# Patient Record
Sex: Male | Born: 1935 | Race: White | Hispanic: No | Smoking: Former smoker
Health system: Southern US, Community
[De-identification: ages and names within clinical notes are randomized; demographics above are authoritative.]

## PROBLEM LIST (undated history)

## (undated) DIAGNOSIS — K219 Gastro-esophageal reflux disease without esophagitis: Secondary | ICD-10-CM

## (undated) DIAGNOSIS — J40 Bronchitis, not specified as acute or chronic: Secondary | ICD-10-CM

## (undated) DIAGNOSIS — I2510 Atherosclerotic heart disease of native coronary artery without angina pectoris: Secondary | ICD-10-CM

## (undated) DIAGNOSIS — J189 Pneumonia, unspecified organism: Secondary | ICD-10-CM

## (undated) DIAGNOSIS — I4891 Unspecified atrial fibrillation: Secondary | ICD-10-CM

## (undated) DIAGNOSIS — E669 Obesity, unspecified: Secondary | ICD-10-CM

## (undated) DIAGNOSIS — K5900 Constipation, unspecified: Secondary | ICD-10-CM

## (undated) DIAGNOSIS — R609 Edema, unspecified: Secondary | ICD-10-CM

## (undated) DIAGNOSIS — R269 Unspecified abnormalities of gait and mobility: Secondary | ICD-10-CM

## (undated) DIAGNOSIS — I2699 Other pulmonary embolism without acute cor pulmonale: Secondary | ICD-10-CM

## (undated) DIAGNOSIS — G2581 Restless legs syndrome: Secondary | ICD-10-CM

## (undated) DIAGNOSIS — R32 Unspecified urinary incontinence: Secondary | ICD-10-CM

## (undated) DIAGNOSIS — I10 Essential (primary) hypertension: Secondary | ICD-10-CM

## (undated) DIAGNOSIS — G459 Transient cerebral ischemic attack, unspecified: Secondary | ICD-10-CM

## (undated) DIAGNOSIS — G4730 Sleep apnea, unspecified: Secondary | ICD-10-CM

## (undated) DIAGNOSIS — I509 Heart failure, unspecified: Secondary | ICD-10-CM

## (undated) HISTORY — PX: HIP SURGERY: SHX245

## (undated) HISTORY — PX: KNEE SURGERY: SHX244

## (undated) HISTORY — DX: Sleep apnea, unspecified: G47.30

## (undated) HISTORY — DX: Unspecified abnormalities of gait and mobility: R26.9

## (undated) HISTORY — DX: Unspecified atrial fibrillation: I48.91

## (undated) HISTORY — DX: Atherosclerotic heart disease of native coronary artery without angina pectoris: I25.10

## (undated) HISTORY — DX: Obesity, unspecified: E66.9

## (undated) HISTORY — DX: Other pulmonary embolism without acute cor pulmonale: I26.99

## (undated) HISTORY — PX: TONSILLECTOMY: SUR1361

## (undated) HISTORY — PX: APPENDECTOMY: SHX54

## (undated) HISTORY — PX: TOTAL HIP ARTHROPLASTY: SHX124

## (undated) HISTORY — PX: CHOLECYSTECTOMY: SHX55

## (undated) HISTORY — PX: ADENOIDECTOMY: SUR15

## (undated) HISTORY — DX: Edema, unspecified: R60.9

## (undated) HISTORY — DX: Constipation, unspecified: K59.00

## (undated) HISTORY — PX: REPLACEMENT TOTAL KNEE BILATERAL: SUR1225

---

## 2016-03-02 ENCOUNTER — Encounter: Payer: Self-pay | Admitting: Gastroenterology

## 2016-04-06 ENCOUNTER — Encounter: Payer: Self-pay | Admitting: *Deleted

## 2016-04-06 DIAGNOSIS — G2581 Restless legs syndrome: Secondary | ICD-10-CM | POA: Insufficient documentation

## 2016-04-06 DIAGNOSIS — K5900 Constipation, unspecified: Secondary | ICD-10-CM | POA: Insufficient documentation

## 2016-04-06 DIAGNOSIS — I482 Chronic atrial fibrillation, unspecified: Secondary | ICD-10-CM | POA: Insufficient documentation

## 2016-04-06 DIAGNOSIS — G4730 Sleep apnea, unspecified: Secondary | ICD-10-CM | POA: Insufficient documentation

## 2016-04-06 DIAGNOSIS — I25810 Atherosclerosis of coronary artery bypass graft(s) without angina pectoris: Secondary | ICD-10-CM

## 2016-04-06 DIAGNOSIS — Z86711 Personal history of pulmonary embolism: Secondary | ICD-10-CM | POA: Insufficient documentation

## 2016-04-09 ENCOUNTER — Encounter (HOSPITAL_COMMUNITY): Payer: Self-pay | Admitting: Family Medicine

## 2016-04-09 ENCOUNTER — Emergency Department (HOSPITAL_COMMUNITY)
Admission: EM | Admit: 2016-04-09 | Discharge: 2016-04-10 | Disposition: A | Discharge status: Home / Self Care | Payer: Medicare Other | Attending: Emergency Medicine | Admitting: Emergency Medicine

## 2016-04-09 ENCOUNTER — Emergency Department (HOSPITAL_COMMUNITY): Payer: Medicare Other

## 2016-04-09 DIAGNOSIS — Z8673 Personal history of transient ischemic attack (TIA), and cerebral infarction without residual deficits: Secondary | ICD-10-CM | POA: Diagnosis not present

## 2016-04-09 DIAGNOSIS — N3001 Acute cystitis with hematuria: Secondary | ICD-10-CM | POA: Diagnosis not present

## 2016-04-09 DIAGNOSIS — J181 Lobar pneumonia, unspecified organism: Secondary | ICD-10-CM | POA: Insufficient documentation

## 2016-04-09 DIAGNOSIS — I110 Hypertensive heart disease with heart failure: Secondary | ICD-10-CM | POA: Diagnosis not present

## 2016-04-09 DIAGNOSIS — I509 Heart failure, unspecified: Secondary | ICD-10-CM | POA: Diagnosis not present

## 2016-04-09 DIAGNOSIS — Z87891 Personal history of nicotine dependence: Secondary | ICD-10-CM | POA: Diagnosis not present

## 2016-04-09 DIAGNOSIS — Z7901 Long term (current) use of anticoagulants: Secondary | ICD-10-CM | POA: Insufficient documentation

## 2016-04-09 DIAGNOSIS — J189 Pneumonia, unspecified organism: Secondary | ICD-10-CM

## 2016-04-09 DIAGNOSIS — R933 Abnormal findings on diagnostic imaging of other parts of digestive tract: Secondary | ICD-10-CM | POA: Diagnosis not present

## 2016-04-09 DIAGNOSIS — R05 Cough: Secondary | ICD-10-CM | POA: Diagnosis present

## 2016-04-09 HISTORY — DX: Heart failure, unspecified: I50.9

## 2016-04-09 HISTORY — DX: Unspecified atrial fibrillation: I48.91

## 2016-04-09 HISTORY — DX: Unspecified urinary incontinence: R32

## 2016-04-09 HISTORY — DX: Gastro-esophageal reflux disease without esophagitis: K21.9

## 2016-04-09 HISTORY — DX: Transient cerebral ischemic attack, unspecified: G45.9

## 2016-04-09 HISTORY — DX: Essential (primary) hypertension: I10

## 2016-04-09 HISTORY — DX: Restless legs syndrome: G25.81

## 2016-04-09 HISTORY — DX: Bronchitis, not specified as acute or chronic: J40

## 2016-04-09 LAB — URINALYSIS, ROUTINE W REFLEX MICROSCOPIC
BILIRUBIN URINE: NEGATIVE
GLUCOSE, UA: NEGATIVE mg/dL
KETONES UR: NEGATIVE mg/dL
Nitrite: NEGATIVE
PROTEIN: NEGATIVE mg/dL
Specific Gravity, Urine: 1.015 (ref 1.005–1.030)
pH: 7 (ref 5.0–8.0)

## 2016-04-09 LAB — CBC
HCT: 44.3 % (ref 39.0–52.0)
Hemoglobin: 14.1 g/dL (ref 13.0–17.0)
MCH: 30.2 pg (ref 26.0–34.0)
MCHC: 31.8 g/dL (ref 30.0–36.0)
MCV: 94.9 fL (ref 78.0–100.0)
PLATELETS: 264 10*3/uL (ref 150–400)
RBC: 4.67 MIL/uL (ref 4.22–5.81)
RDW: 14.5 % (ref 11.5–15.5)
WBC: 10.2 10*3/uL (ref 4.0–10.5)

## 2016-04-09 LAB — BASIC METABOLIC PANEL
Anion gap: 6 (ref 5–15)
BUN: 12 mg/dL (ref 6–20)
CALCIUM: 8.7 mg/dL — AB (ref 8.9–10.3)
CHLORIDE: 100 mmol/L — AB (ref 101–111)
CO2: 31 mmol/L (ref 22–32)
CREATININE: 0.77 mg/dL (ref 0.61–1.24)
GFR calc Af Amer: >60 mL/min (ref >60)
GFR calc non Af Amer: >60 mL/min (ref >60)
Glucose, Bld: 109 mg/dL — ABNORMAL HIGH (ref 65–99)
Potassium: 4.2 mmol/L (ref 3.5–5.1)
SODIUM: 137 mmol/L (ref 135–145)

## 2016-04-09 LAB — URINE MICROSCOPIC-ADD ON
RBC / HPF: NONE SEEN RBC/hpf (ref 0–5)
Squamous Epithelial / LPF: NONE SEEN

## 2016-04-09 LAB — BRAIN NATRIURETIC PEPTIDE: B Natriuretic Peptide: 170.8 pg/mL — ABNORMAL HIGH (ref 0.0–100.0)

## 2016-04-09 LAB — TROPONIN I: Troponin I: <0.03 ng/mL (ref <0.03)

## 2016-04-09 LAB — LIPASE, BLOOD: Lipase: 26 U/L (ref 11–51)

## 2016-04-09 LAB — HEPATIC FUNCTION PANEL
ALK PHOS: 63 U/L (ref 38–126)
ALT: 31 U/L (ref 17–63)
AST: 46 U/L — ABNORMAL HIGH (ref 15–41)
Albumin: 3.2 g/dL — ABNORMAL LOW (ref 3.5–5.0)
BILIRUBIN DIRECT: 0.1 mg/dL (ref 0.1–0.5)
BILIRUBIN INDIRECT: 0.7 mg/dL (ref 0.3–0.9)
BILIRUBIN TOTAL: 0.8 mg/dL (ref 0.3–1.2)
TOTAL PROTEIN: 6.9 g/dL (ref 6.5–8.1)

## 2016-04-09 LAB — CBG MONITORING, ED: Glucose-Capillary: 113 mg/dL — ABNORMAL HIGH (ref 65–99)

## 2016-04-09 MED ORDER — FUROSEMIDE 10 MG/ML IJ SOLN
60.0000 mg | Freq: Once | INTRAMUSCULAR | Status: AC
  Administered 2016-04-09: 60 mg via INTRAVENOUS
  Filled 2016-04-09: qty 8

## 2016-04-09 MED ORDER — LEVOFLOXACIN 750 MG PO TABS: 750.0000 mg | ORAL_TABLET | Freq: Every day | ORAL | Status: DC

## 2016-04-09 MED ORDER — SODIUM CHLORIDE 0.9 % IV BOLUS (SEPSIS)
500.0000 mL | Freq: Once | INTRAVENOUS | Status: AC
  Administered 2016-04-09: 500 mL via INTRAVENOUS

## 2016-04-09 MED ORDER — SODIUM CHLORIDE 0.9 % IJ SOLN
INTRAMUSCULAR | Status: AC
  Filled 2016-04-09: qty 50

## 2016-04-09 MED ORDER — MORPHINE SULFATE (PF) 4 MG/ML IV SOLN
4.0000 mg | Freq: Once | INTRAVENOUS | Status: AC
  Administered 2016-04-09: 4 mg via INTRAVENOUS
  Filled 2016-04-09: qty 1

## 2016-04-09 MED ORDER — LEVOFLOXACIN 500 MG PO TABS: 500.0000 mg | ORAL_TABLET | Freq: Every day | ORAL | 0 refills | Status: DC

## 2016-04-09 MED ORDER — METOPROLOL TARTRATE 5 MG/5ML IV SOLN: 5.0000 mg | Freq: Once | INTRAVENOUS | Status: DC

## 2016-04-09 MED ORDER — IOPAMIDOL (ISOVUE-300) INJECTION 61%
100.0000 mL | Freq: Once | INTRAVENOUS | Status: AC | PRN
  Administered 2016-04-09: 100 mL via INTRAVENOUS

## 2016-04-09 MED ORDER — CEPHALEXIN 500 MG PO CAPS
500.0000 mg | ORAL_CAPSULE | Freq: Once | ORAL | Status: AC
  Administered 2016-04-09: 500 mg via ORAL
  Filled 2016-04-09: qty 1

## 2016-04-09 MED ORDER — IOPAMIDOL (ISOVUE-300) INJECTION 61%
INTRAVENOUS | Status: AC
  Filled 2016-04-09: qty 100

## 2016-04-09 NOTE — ED Notes (Signed)
Pt in radiology 

## 2016-04-09 NOTE — ED Triage Notes (Signed)
[facility] Patient was working with physical therapy when he had a witnessed syncopal episode lasting about 15 seconds. Pt is complaining of a headache. Pain described as pressure.

## 2016-04-09 NOTE — ED Notes (Signed)
Bed: WA21 Expected date:  Expected time:  Means of arrival:  Comments: EMS- 82 yo M, syncope

## 2016-04-09 NOTE — ED Provider Notes (Signed)
[facility]-EMERGENCY DEPT Provider Note   CSN: [id number] Arrival date & time: 04/09/16  1552     History   Chief Complaint Chief Complaint  Patient presents with  . Loss of Consciousness    HPI Joseph Barrera is a 82 y.o. male.  The history is provided by the patient.   [facility] with a [facility] syncopal episode.  Complaint mild headache at this time.  Denies chest pain shortness of breath.  Reports upper abdominal discomfort.  No fevers or chills.  Mild productive cough.  Patient is recently relocated to [place] from [place].  He has a history of A. fib and congestive heart failure as well as hypertension and gastroesophageal reflux disease.  He reports is compliant with his medications.  He states she's had a long-standing history of syncope has had extensive workup in the [place] system.  He states that his had intermittent syncopal episodes for 4 years and no clear etiology can be found.  His had both cardiac and GI workups.  He also reports a long-standing history of recurrent upper abdominal pain for which she's had several endoscopies.    Past Medical History:  Diagnosis Date  . A-fib (HCC)   . Bronchitis   . CHF (congestive heart failure) (HCC)   . GERD (gastroesophageal reflux disease)   . Hypertension   . Restless leg syndrome   . TIA (transient ischemic attack)   . Urinary bladder incontinence     There are no active problems to display for this patient.   Past Surgical History:  Procedure Laterality Date  . ADENOIDECTOMY    . APPENDECTOMY    . CHOLECYSTECTOMY    . HIP SURGERY Left   . KNEE SURGERY Bilateral   . TONSILLECTOMY         Home Medications    Prior to Admission medications   Medication Sig Start Date End Date Taking? Authorizing Provider  b complex vitamins capsule Take 1 capsule by mouth daily.   Yes Historical Provider, MD  cetirizine (ZYRTEC) 10 MG tablet Take 10 mg by mouth  daily.   Yes Historical Provider, MD  Cholecalciferol (VITAMIN D3 ULTRA STRENGTH) 5000 units capsule Take 5,000 Units by mouth daily.   Yes Historical Provider, MD  furosemide (LASIX) 40 MG tablet Take 40 mg by mouth daily.   Yes Historical Provider, MD  Multiple Vitamins-Minerals (MULTIVITAMIN ADULT) TABS Take 1 tablet by mouth daily.   Yes Historical Provider, MD  omeprazole (PRILOSEC) 20 MG capsule Take 20 mg by mouth every evening.   Yes Historical Provider, MD  potassium chloride SA (K-DUR,KLOR-CON) 20 MEQ tablet Take 20 mEq by mouth 2 (two) times daily.   Yes Historical Provider, MD  rOPINIRole (REQUIP) 4 MG tablet Take 4 mg by mouth daily.   Yes Historical Provider, MD  traMADol (ULTRAM) 50 MG tablet Take 50 mg by mouth every 6 (six) hours as needed for moderate pain or severe pain.   Yes Historical Provider, MD  warfarin (COUMADIN) 5 MG tablet Take 2.5-5 mg by mouth daily. Take 1 tablet (5 mg) by mouth Mon-Fri and Take 0.5 tablet (2.5 mg) by mouth Sat-Sun   Yes Historical Provider, MD    Family History History reviewed. No pertinent family history.  Social History Social History  Substance Use Topics  . Smoking status: Former Smoker    Quit date: 1963  . Smokeless tobacco: Never Used  . Alcohol use No     Comment:  Stopped in 2010     Allergies   Benadryl [diphenhydramine] and Oxycodone   Review of Systems Review of Systems  All other systems reviewed and are negative.    Physical Exam Updated Vital Signs BP 124/76 (BP Location: Left Arm)   Pulse 114   Temp 97.1 F (36.2 C) (Oral)   Resp 19   Ht 5\' 11"  (1.803 m)   Wt 177 lb (80.3 kg)   SpO2 94%   BMI 24.69 kg/m   Physical Exam  Constitutional: He is oriented to person, place, and time. He appears well-developed and well-nourished.  HENT:  Head: Normocephalic and atraumatic.  Eyes: EOM are normal.  Neck: Normal range of motion.  Cardiovascular: Normal rate, regular rhythm, normal heart sounds and intact  distal pulses.   Pulmonary/Chest: Effort normal and breath sounds normal. No respiratory distress.  Abdominal: Soft. He exhibits no distension. There is no tenderness.  Musculoskeletal: Normal range of motion.  Neurological: He is alert and oriented to person, place, and time.  Skin: Skin is warm and dry.  Psychiatric: He has a normal mood and affect. Judgment normal.  Nursing note and vitals reviewed.    ED Treatments / Results  Labs (all labs ordered are listed, but only abnormal results are displayed) Labs Reviewed  BASIC METABOLIC PANEL - Abnormal; Notable for the following:       Result Value   Chloride 100 (*)    Glucose, Bld 109 (*)    Calcium 8.7 (*)    All other components within normal limits  URINALYSIS, ROUTINE W REFLEX MICROSCOPIC (NOT AT [facility]) - Abnormal; Notable for the following:    APPearance CLOUDY (*)    Hgb urine dipstick SMALL (*)    Leukocytes, UA LARGE (*)    All other components within normal limits  HEPATIC FUNCTION PANEL - Abnormal; Notable for the following:    Albumin 3.2 (*)    AST 46 (*)    All other components within normal limits  URINE MICROSCOPIC-ADD ON - Abnormal; Notable for the following:    Bacteria, UA MANY (*)    All other components within normal limits  BRAIN NATRIURETIC PEPTIDE - Abnormal; Notable for the following:    B Natriuretic Peptide 170.8 (*)    All other components within normal limits  CBG MONITORING, ED - Abnormal; Notable for the following:    Glucose-Capillary 113 (*)    All other components within normal limits  URINE CULTURE  CBC  TROPONIN I  LIPASE, BLOOD    EKG  EKG Interpretation None       Radiology Dg Chest 2 View  Result Date: 04/09/2016 CLINICAL DATA:  Short of breath EXAM: CHEST  2 VIEW COMPARISON:  None. FINDINGS: Lungs are under aerated. Patchy airspace opacities are seen throughout the left lung. The right hemidiaphragm is elevated. There is vascular crowding and atelectasis at the right base.  No pneumothorax. IMPRESSION: Extensive patchy airspace disease throughout the left lung. Electronically Signed   By: Arthur  Hoss M.D.   On: 04/09/2016 20:39   Ct Abdomen Pelvis W Contrast  Result Date: 04/09/2016 CLINICAL DATA:  Status post syncope, with headache. Concern for abdominal injury. Initial encounter. EXAM: CT ABDOMEN AND PELVIS WITH CONTRAST TECHNIQUE: Multidetector CT imaging of the abdomen and pelvis was performed using the standard protocol following bolus administration of intravenous contrast. CONTRAST:  <MEASUREMEAvondale EstKaiser Fnd Hosp - GYork <MEASUREMENTSan PatrCastlevieGShady<MEASUREMENTTallmMemorial HosGPalisad<MEASUREMENTWoodMainegeneral MediGAl<MEASUREMENTSouthGrand VieGPryo<MEASUREMENTRanchos Penitas Dartmouth [facility] EndoscGMan<MEASUREMENTUnion Saint JameGFore<MEASUREMENTRancho Mission VCoffeyville Regional Medi<MEASUREMENTLoBronson Battle CreeGAp<MEASUREMENTPhiladelOcala Regional MediGM46.9dison Bi00 IOPAMIDOL (ISOVUE-300) INJECTION 61% COMPARISON:  None. FINDINGS: Lower chest: Patchy bibasilar airspace opacification may reflect atelectasis or pneumonia. Mild  coronary artery calcification is noted. Hepatobiliary: Scattered calcified granulomata are noted throughout the liver. The patient is status post cholecystectomy, with clips noted at the gallbladder fossa. The common bile duct remains normal in caliber. Pancreas: The pancreas is within normal limits. Spleen: Scattered calcified granulomata are seen within the spleen. Adrenals/Urinary Tract: The adrenal glands are unremarkable in appearance. Nonspecific perinephric stranding is noted bilaterally. There is no evidence of hydronephrosis. No renal or ureteral stones are identified. A small right renal cyst is noted. Stomach/Bowel: The stomach is unremarkable in appearance. The small bowel is within normal limits. The patient is status post appendectomy. The colon is unremarkable in appearance. Vascular/Lymphatic: Scattered calcification is seen along the abdominal aorta and its branches. No retroperitoneal or pelvic sidewall lymphadenopathy is seen. Reproductive: Bladder wall thickening raises concern for cystitis. The prostate is borderline enlarged. Other: No additional soft tissue abnormalities are seen. Musculoskeletal: No acute osseous abnormalities are identified. The patient's left hip arthroplasty is grossly  unremarkable in appearance, though incompletely characterized. Vacuum phenomenon is noted along the lower lumbar spine. There is marked diffuse atrophy of the paraspinal musculature, and of the musculature about the left hip. The visualized musculature is unremarkable in appearance. IMPRESSION: 1. Bladder wall thickening raises concern for cystitis. 2. Patchy bibasilar airspace opacification may reflect atelectasis or possibly pneumonia. 3. Small right renal cyst noted. 4. Scattered aortic atherosclerosis. 5. Borderline enlarged prostate. 6. Marked diffuse atrophy of the paraspinal musculature, and of the musculature about the left hip. Electronically Signed   By: Roanna Raider M.D.   On: 04/09/2016 19:14    Procedures Procedures (including critical care time)  Medications Ordered in ED Medications  iopamidol (ISOVUE-300) 61 % injection (not administered)  sodium chloride 0.9 % injection (not administered)  levofloxacin (LEVAQUIN) tablet 750 mg (not administered)  sodium chloride 0.9 % bolus 500 mL (0 mLs Intravenous Stopped 04/09/16 1922)  morphine 4 MG/ML injection 4 mg (4 mg Intravenous Given 04/09/16 1719)  cephALEXin (KEFLEX) capsule 500 mg (500 mg Oral Given 04/09/16 1724)  iopamidol (ISOVUE-300) 61 % injection 100 mL (100 mLs Intravenous Contrast Given 04/09/16 1857)  furosemide (LASIX) injection 60 mg (60 mg Intravenous Given 04/09/16 2047)     Initial Impression / Assessment and Plan / ED Course  I have reviewed the triage vital signs and the nursing notes.  Pertinent labs & imaging results that were available during my care of the patient were reviewed by me and considered in my medical decision making (see chart for details).  Clinical Course     Patient treated for acute cystitis.  He states that he felt slightly short of breath when he laid flat for the CT scan.  He feels like he would benefit from Lasix.  He states he intermittently takes Lasix when he has fluid buildup.  He  reports no significant new swelling in his lower extremities.   Is given dose of Lasix and states that his breathing feels much better at this time.  I will cover him for possible left-sided community acquired pneumonia.  This may be asymmetric pulmonary edema.  Otherwise BMP is somewhat reassuring.  Regards to syncope both he and his daughter report that his had an extensive workup for this.  He has known A. fib.  He is rate controlled here.  He'll be started on Levaquin which should cover both his urinary tract infection as well as any pneumonia.  He ambulated in the ER with a walker and had no hypoxia and no  increased work of breathing.  He feels good like to go home.  I spoke with both he and his daughter.  His daughter is agreeable to discharge home.  She will follow-up with him in the nursing home tomorrow.  He has staff that rounds on him in the nursing home.  He understands to return to the ER for new or worsening symptoms  Final Clinical Impressions(s) / ED Diagnoses   Final diagnoses:  Acute cystitis with hematuria  Community acquired pneumonia of left lower lobe of lung (HCC)    New Prescriptions New Prescriptions   LEVOFLOXACIN (LEVAQUIN) 500 MG TABLET    Take 1 tablet (500 mg total) by mouth daily.     Azalia Bilis, MD 04/10/16 [phone]

## 2016-04-09 NOTE — ED Notes (Signed)
Lab is adding on BNP. (Joseph Barrera)

## 2016-04-09 NOTE — ED Notes (Signed)
Applied oxygen 2L via [place] due to patient's oxygen level dropped while doing orthostatics.

## 2016-04-10 DIAGNOSIS — J181 Lobar pneumonia, unspecified organism: Secondary | ICD-10-CM | POA: Diagnosis not present

## 2016-04-10 MED ORDER — METOPROLOL TARTRATE 25 MG PO TABS
25.0000 mg | ORAL_TABLET | Freq: Once | ORAL | Status: AC
  Administered 2016-04-10: 25 mg via ORAL
  Filled 2016-04-10: qty 1

## 2016-04-10 NOTE — ED Notes (Signed)
[facility] here for patient transportation.

## 2016-04-10 NOTE — ED Notes (Signed)
Notified [facility] for transportation back to facility.  

## 2016-04-12 LAB — URINE CULTURE: Culture: >=100000 — AB

## 2016-04-13 ENCOUNTER — Telehealth (HOSPITAL_BASED_OUTPATIENT_CLINIC_OR_DEPARTMENT_OTHER): Payer: Self-pay

## 2016-04-13 NOTE — Telephone Encounter (Signed)
Post ED Visit - Positive Culture Follow-up  Culture report reviewed by antimicrobial stewardship pharmacist:  []  Enzo BiNathan Batchelder, Pharm.D. []  Celedonio MiyamotoJeremy Frens, Pharm.D., BCPS []  Garvin FilaMike Maccia, Pharm.D. []  Georgina PillionElizabeth Martin, Pharm.D., BCPS []  NicolausMinh Pham, [address] BoulevardPharm.D., BCPS, AAHIVP []  Estella HuskMichelle Turner, Pharm.D., BCPS, AAHIVP []  Tennis Mustassie Stewart, [address]Pharm.D. []  Rob Hall SummitVincent, [place]Pharm.D.  Casilda Carlsaylor Stone Pharm D Positive urine culture Treated with levofloxacin, organism sensitive to the same and no further patient follow-up is required at this time.  Jerry CarasCullom, Lois Ostrom Burnett 04/13/2016, 12:04 PM

## 2016-04-17 ENCOUNTER — Encounter: Payer: Self-pay | Admitting: Cardiology

## 2016-04-17 ENCOUNTER — Ambulatory Visit (INDEPENDENT_AMBULATORY_CARE_PROVIDER_SITE_OTHER): Payer: Medicare Other | Admitting: Cardiology

## 2016-04-17 VITALS — BP 100/52 | HR 82 | Ht 69.0 in | Wt 277.8 lb

## 2016-04-17 DIAGNOSIS — I482 Chronic atrial fibrillation, unspecified: Secondary | ICD-10-CM

## 2016-04-17 DIAGNOSIS — I25810 Atherosclerosis of coronary artery bypass graft(s) without angina pectoris: Secondary | ICD-10-CM

## 2016-04-17 NOTE — Patient Instructions (Signed)
Medication Instructions:  Your physician recommends that you continue on your current medications as directed. Please refer to the Current Medication list given to you today.   Labwork: None ordered  Testing/Procedures: None ordered  Follow-Up: Your physician recommends that you schedule a follow-up appointment in: 2-3 MONTHS WITH DR. Elberta FortisAMNITZ   Any Other Special Instructions Will Be Listed Below (If Applicable).    If you need a refill on your cardiac medications before your next appointment, please call your pharmacy.

## 2016-04-17 NOTE — Progress Notes (Signed)
04/17/2016 Joseph Barrera   [birth year]  [id number]  Primary Physician No primary care provider on file. Primary Cardiologist: New (Dr. Elberta Fortisamnitz, DOD)   Reason for Visit/CC: New Patient Evaluation for Atrial Fibrillation  HPI:  Joseph Barrera presents to clinic today as a new patient. He has been referred by [facility] He recently moved to [place] from [place] to be closer to family. He resides at an assisted living facility. He has significant cardiovascular history and has been followed by a cardiologist in [place]. His history is significant for atrial fibrillation, TIA 2, PE/DVT, obstructive sleep apnea, reported history of CAD/MI however the patient does not recall ever having a heart catheterization. He reports that he had a chemical stress test less than a year ago and was told that it was okay. He has also had an echocardiogram within the last year. He has chronic lower extremity edema and wears compression stockings. He is also on Lasix as well as a beta blocker. His INRs are followed at his assisted living facility.  From a symptom standpoint, he is asymptomatic. He has trace bilateral lower extremity edema however he is wearing compression stockings. He denies any resting dyspnea. No anginal symptomatology. He notes atypical chest pain. It hurts his chest when he bend over. No orthopnea or PND. He is fully compliant with his CPAP. His EKG shows atrial fibrillation with controlled ventricular response of 82 bpm. Blood pressure is soft but stable at 100/52. He denies any dizziness, lightheadedness, syncope/near-syncope.  Current Meds  Medication Sig  . cetirizine (ZYRTEC) 10 MG chewable tablet Chew 10 mg by mouth daily.  . Cholecalciferol (VITAMIN D3) 5000 units CAPS Take 5,000 Units by mouth daily.  . fluticasone-salmeterol (ADVAIR HFA) 45-21 MCG/ACT inhaler Inhale 2 puffs into the lungs 2 (two) times daily as needed.  . furosemide (LASIX) 40 MG tablet Take 40 mg by  mouth.  . metoprolol (LOPRESSOR) 50 MG tablet Take 50 mg by mouth 3 (three) times daily.  Marland Kitchen. omeprazole (PRILOSEC) 20 MG capsule Take 20 mg by mouth daily.  . potassium chloride SA (K-DUR,KLOR-CON) 20 MEQ tablet Take 20 mEq by mouth 2 (two) times daily.  Marland Kitchen. rOPINIRole (REQUIP) 4 MG tablet Take 4 mg by mouth at bedtime.  . traMADol (ULTRAM) 50 MG tablet Take by mouth every 6 (six) hours as needed.  . vitamin B-12 (CYANOCOBALAMIN) 500 MCG tablet Take 500 mcg by mouth daily.  Marland Kitchen. warfarin (COUMADIN) 5 MG tablet Take 5 mg by mouth daily.   Allergies  Allergen Reactions  . Oxycodone Other (See Comments)    nightmares   Past Medical History:  Diagnosis Date  . Atrial fibrillation (HCC)   . CAD (coronary artery disease)   . Constipation   . Edema   . Gait disturbance   . Obesity   . Pulmonary embolism (HCC)   . Restless leg syndrome   . Sleep hypopnea    Family History  Problem Relation Age of Onset  . CVA Father   . Lead poisoning Brother    Past Surgical History:  Procedure Laterality Date  . APPENDECTOMY    . CHOLECYSTECTOMY    . REPLACEMENT TOTAL KNEE BILATERAL    . TOTAL HIP ARTHROPLASTY Left    Social History   Social History  . Marital status: Widowed    Spouse name: N/A  . Number of children: 3  . Years of education: N/A   Occupational History  . retired    Social History Main  Topics  . Smoking status: Former Smoker    Types: Cigarettes    Quit date: 1963  . Smokeless tobacco: Never Used  . Alcohol use No  . Drug use: No  . Sexual activity: Not on file   Other Topics Concern  . Not on file   Social History Narrative  . No narrative on file     Review of Systems: General: negative for chills, fever, night sweats or weight changes.  Cardiovascular: negative for chest pain, dyspnea on exertion, edema, orthopnea, palpitations, paroxysmal nocturnal dyspnea or shortness of breath Dermatological: negative for rash Respiratory: negative for cough or  wheezing Urologic: negative for hematuria Abdominal: negative for nausea, vomiting, diarrhea, bright red blood per rectum, melena, or hematemesis Neurologic: negative for visual changes, syncope, or dizziness All other systems reviewed and are otherwise negative except as noted above.   Physical Exam:  Blood pressure (!) 100/52, pulse 82, height 5\' 9"  (1.753 m), weight 277 lb 12 oz (126 kg).  General appearance: alert, cooperative and no distress Neck: no carotid bruit, no JVD, supple, symmetrical, trachea midline and thyroid not enlarged, symmetric, no tenderness/mass/nodules Lungs: clear to auscultation bilaterally Heart: irregularly irregular rhythm Extremities: trace bilatearl LEE Pulses: 2+ and symmetric Skin: Skin color, texture, turgor normal. No rashes or lesions Neurologic: Grossly normal  EKG atrial fibrillation 82 bpm   ASSESSMENT AND PLAN:   1. Atrial Fibrillation: EKG shows CVR. HR 82 bpm. Continue BB therapy with metoprolol. He has a h/o TIAs and is on chronic coumadin therapy. INRs are followed at ALF.  2. Presumed Diastolic? Systolic HF: Pt with chronic bilateral LEE, for which he takes lasix. He reports he had an echocardiogram within the last year. We will attempt to obtain these records from [place].  3. ?CAD: pt reports h/o MI. He denies any stents. He had a chemical stress test within the past year. [facility]  4. 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   5. OSA: he reports full compliance with CPAP.   PLAN  Pt is stable from a cardiac standpoint. He is asymptomatic. BP and HR both stable. He is on appropriate medical therapy. We will contact his former cardiologist in [place] for cardiac records (Dr. Arlana LindauKatan Desai, [phone]). F/u in 2-3 months with Dr. Elberta Fortisamnitz. I've discussed plan with Dr. Elberta Fortisamnitz, DOD. He has agreed to be the patient's primary cardiologist.   Robbie LisBrittainy Avonna Iribe PA-C 04/17/2016 3:36 PM

## 2016-04-18 ENCOUNTER — Encounter: Payer: Self-pay | Admitting: Cardiology

## 2016-04-19 NOTE — Addendum Note (Signed)
Addended by: Osa CraverGUIJOZA, Yahya Boldman M on: 04/19/2016 03:09 PM   Modules accepted: Orders

## 2016-05-02 ENCOUNTER — Encounter: Payer: Self-pay | Admitting: Cardiology

## 2016-05-03 ENCOUNTER — Ambulatory Visit (INDEPENDENT_AMBULATORY_CARE_PROVIDER_SITE_OTHER): Payer: Medicare Other | Admitting: Gastroenterology

## 2016-05-03 ENCOUNTER — Encounter: Payer: Self-pay | Admitting: Gastroenterology

## 2016-05-03 VITALS — BP 92/44 | HR 80 | Ht 69.0 in | Wt 274.0 lb

## 2016-05-03 DIAGNOSIS — I25810 Atherosclerosis of coronary artery bypass graft(s) without angina pectoris: Secondary | ICD-10-CM | POA: Diagnosis not present

## 2016-05-03 DIAGNOSIS — R1084 Generalized abdominal pain: Secondary | ICD-10-CM

## 2016-05-03 DIAGNOSIS — K5900 Constipation, unspecified: Secondary | ICD-10-CM | POA: Diagnosis not present

## 2016-05-03 MED ORDER — GABAPENTIN 300 MG PO CAPS: ORAL_CAPSULE | ORAL | 3 refills | Status: AC

## 2016-05-03 MED ORDER — POLYETHYLENE GLYCOL 3350 17 GM/SCOOP PO POWD: ORAL | 3 refills | Status: DC

## 2016-05-03 NOTE — Patient Instructions (Signed)
If you are age 80 or older, your body mass index should be between 23-30. Your Body mass index is 40.46 kg/m. If this is out of the aforementioned range listed, please consider follow up with your Primary Care Provider.  If you are age 80 or younger, your body mass index should be between 19-25. Your Body mass index is 40.46 kg/m. If this is out of the aformentioned range listed, please consider follow up with your Primary Care Provider.   We have sent the following medications to your pharmacy for you to pick up at your convenience:  Gabapentin 300mg . Take one tablet nightly for 1 week then increase to two tablets nightly.  Miralax. Take twice daily.   Please purchase the over the counter cream called Capcaison to use as needed.  We will contact your PCP regarding your stool studies.  Thank you.

## 2016-05-03 NOTE — Progress Notes (Signed)
HPI :  82 y/o male with a history of atrial fibrillation/CHF, history of syncope, history of pulmonary embolism on Coumadin, unstable gait here for abdominal pain and constipation.   He reports he has been having abdominal pains in his abdomen for "at least 3 years". He endorses pain in the entire abdomen, upper, mid, and lower - diffusely all over. He reports it feels like a stabbing sharp pain, feels like his body goes to sleep and he experiences "numbness" at times in his abdomen. He reports it is present constantly, but severity fluctuates. He reports usually 4-5/10, rated 9/10 today.   No vomiting. He eats well. Sleeps well. Positional changes such as twisting changes can make symptoms worse. Eating does not make at all, he has no limitations with diet. He has a bowel movement once per week, if that. He reports having a bowel movement does not make his abdomen feel better. No blood in the stools. He reports having had stool testing for blood and negative recently. He has had a prior colonoscopy around age 82, not sure of the result. No FH of colon cancer.  He uses a walker to walk, had left hip replacement, chronic instability of gait.Marland Kitchen. He has a history of low back pain.   He takes omeprazole 20mg  daily, he is not sure why. He does not have any heartburn. He has been taking bentyl PRN which does not help.   He had a CT scan November 20 - showing bladder wall thickening, scattered atherosclerosis of the aorta, borderline enlarged prostate. He had a UTI and was treated for cystitis.     Past Medical History:  Diagnosis Date  . A-fib (HCC)   . Atrial fibrillation (HCC)   . Bronchitis   . CAD (coronary artery disease)   . CHF (congestive heart failure) (HCC)   . Constipation   . Edema   . Gait disturbance   . GERD (gastroesophageal reflux disease)   . Hypertension   . Obesity   . Pulmonary embolism (HCC)   . Restless leg syndrome   . Sleep hypopnea   . TIA (transient ischemic  attack)   . Urinary bladder incontinence      Past Surgical History:  Procedure Laterality Date  . ADENOIDECTOMY    . APPENDECTOMY    . CHOLECYSTECTOMY    . HIP SURGERY Left   . KNEE SURGERY Bilateral   . REPLACEMENT TOTAL KNEE BILATERAL    . TONSILLECTOMY    . TOTAL HIP ARTHROPLASTY Left    Family History  Problem Relation Age of Onset  . CVA Father   . Lead poisoning Brother    Social History  Substance Use Topics  . Smoking status: Former Smoker    Types: Cigarettes    Quit date: 1963  . Smokeless tobacco: Never Used  . Alcohol use No     Comment: Stopped in 2010   Current Outpatient Prescriptions  Medication Sig Dispense Refill  . b complex vitamins capsule Take 1 capsule by mouth daily.    . cetirizine (ZYRTEC) 10 MG tablet Take 10 mg by mouth daily.    . Cholecalciferol (VITAMIN D3 ULTRA STRENGTH) 5000 units capsule Take 5,000 Units by mouth daily.    Marland Kitchen. dicyclomine (BENTYL) 20 MG tablet Take 20 mg by mouth every 6 (six) hours.    . furosemide (LASIX) 40 MG tablet Take 40 mg by mouth 2 (two) times daily.    Marland Kitchen. ipratropium-albuterol (DUONEB) 0.5-2.5 (3) MG/3ML SOLN Take 3  mLs by nebulization every 6 (six) hours as needed.    . metoprolol (LOPRESSOR) 50 MG tablet Take 50 mg by mouth 3 (three) times daily.    . Multiple Vitamins-Minerals (MULTIVITAMIN ADULT) TABS Take 1 tablet by mouth daily.    Marland Kitchen omeprazole (PRILOSEC) 20 MG capsule Take 20 mg by mouth daily.    . potassium chloride SA (K-DUR,KLOR-CON) 20 MEQ tablet Take 20 mEq by mouth 2 (two) times daily.    Marland Kitchen rOPINIRole (REQUIP) 4 MG tablet Take 4 mg by mouth at bedtime.    Marland Kitchen spironolactone (ALDACTONE) 50 MG tablet Take 50 mg by mouth 2 (two) times daily.    . vitamin B-12 (CYANOCOBALAMIN) 500 MCG tablet Take 500 mcg by mouth daily.    Marland Kitchen gabapentin (NEURONTIN) 300 MG capsule Take 300mg  nightly for 1 week then increase to 2 tablets nightly. 21 capsule 3  . polyethylene glycol powder (GLYCOLAX/MIRALAX) powder Mix 17g  in 8oz of water twice daily. May titrate as needed. 255 g 3   No current facility-administered medications for this visit.    Allergies  Allergen Reactions  . Oxycodone Other (See Comments)    nightmares  . Benadryl [Diphenhydramine]   . Oxycodone      Review of Systems: All systems reviewed and negative except where noted in HPI.   Lab Results  Component Value Date   WBC 10.2 04/09/2016   HGB 14.1 04/09/2016   HCT 44.3 04/09/2016   MCV 94.9 04/09/2016   PLT 264 04/09/2016    Lab Results  Component Value Date   CREATININE 0.77 04/09/2016   BUN 12 04/09/2016   NA 137 04/09/2016   K 4.2 04/09/2016   CL 100 (L) 04/09/2016   CO2 31 04/09/2016    Lab Results  Component Value Date   ALT 31 04/09/2016   AST 46 (H) 04/09/2016   ALKPHOS 63 04/09/2016   BILITOT 0.8 04/09/2016    Lab Results  Component Value Date   LIPASE 26 04/09/2016      Dg Chest 2 View  Result Date: 04/09/2016 CLINICAL DATA:  Short of breath EXAM: CHEST  2 VIEW COMPARISON:  None. FINDINGS: Lungs are under aerated. Patchy airspace opacities are seen throughout the left lung. The right hemidiaphragm is elevated. There is vascular crowding and atelectasis at the right base. No pneumothorax. IMPRESSION: Extensive patchy airspace disease throughout the left lung. Electronically Signed   By: Jolaine Click M.D.   On: 04/09/2016 20:39   Ct Abdomen Pelvis W Contrast  Result Date: 04/09/2016 CLINICAL DATA:  Status post syncope, with headache. Concern for abdominal injury. Initial encounter. EXAM: CT ABDOMEN AND PELVIS WITH CONTRAST TECHNIQUE: Multidetector CT imaging of the abdomen and pelvis was performed using the standard protocol following bolus administration of intravenous contrast. CONTRAST:  ISOVUE-300 IOPAMIDOL (ISOVUE-300) INJECTION 61% COMPARISON:  None. FINDINGS: Lower chest: Patchy bibasilar airspace opacification may reflect atelectasis or pneumonia. Mild coronary artery calcification is  noted. Hepatobiliary: Scattered calcified granulomata are noted throughout the liver. The patient is status post cholecystectomy, with clips noted at the gallbladder fossa. The common bile duct remains normal in caliber. Pancreas: The pancreas is within normal limits. Spleen: Scattered calcified granulomata are seen within the spleen. Adrenals/Urinary Tract: The adrenal glands are unremarkable in appearance. Nonspecific perinephric stranding is noted bilaterally. There is no evidence of hydronephrosis. No renal or ureteral stones are identified. A small right renal cyst is noted. Stomach/Bowel: The stomach is unremarkable in appearance. The small bowel is within  normal limits. The patient is status post appendectomy. The colon is unremarkable in appearance. Vascular/Lymphatic: Scattered calcification is seen along the abdominal aorta and its branches. No retroperitoneal or pelvic sidewall lymphadenopathy is seen. Reproductive: Bladder wall thickening raises concern for cystitis. The prostate is borderline enlarged. Other: No additional soft tissue abnormalities are seen. Musculoskeletal: No acute osseous abnormalities are identified. The patient's left hip arthroplasty is grossly unremarkable in appearance, though incompletely characterized. Vacuum phenomenon is noted along the lower lumbar spine. There is marked diffuse atrophy of the paraspinal musculature, and of the musculature about the left hip. The visualized musculature is unremarkable in appearance. IMPRESSION: 1. Bladder wall thickening raises concern for cystitis. 2. Patchy bibasilar airspace opacification may reflect atelectasis or possibly pneumonia. 3. Small right renal cyst noted. 4. Scattered aortic atherosclerosis. 5. Borderline enlarged prostate. 6. Marked diffuse atrophy of the paraspinal musculature, and of the musculature about the left hip. Electronically Signed   By: Roanna RaiderJeffery  Chang M.D.   On: 04/09/2016 19:14    Physical Exam: BP (!) 92/44    Pulse 80   Ht 5\' 9"  (1.753 m)   Wt 274 lb (124.3 kg)   BMI 40.46 kg/m  Constitutional: Pleasant, male in no acute distress, using walker HEENT: Normocephalic and atraumatic. Conjunctivae are normal. No scleral icterus. Neck supple.  Cardiovascular: Normal rate, irregularly irregular.  Pulmonary/chest: Effort normal and breath sounds normal. No wheezing, rales or rhonchi. Abdominal: Soft, protuberant / obese abdomen, tender diffusely to palpation in all area. No rash. . There are no masses palpable.  Extremities: (+) 2 edema BL LE Lymphadenopathy: No cervical adenopathy noted. Neurological: Alert and oriented to person place and time. Skin: Skin is warm and dry. No rashes noted. Psychiatric: Normal mood and affect. Behavior is normal.   ASSESSMENT AND PLAN: 82 year old male with multiple comorbidities as outlined above presenting for a new patient evaluation for chronic abdominal pain and constipation. He's had a CT scan within the past few weeks showing cystitis otherwise no acute pathology. He has some scattered aortic atherosclerosis but his symptoms are not consistent with mesenteric ischemia. Labs relatively normal. Given his history of constant/24 7 pain as well as worsening with positional changes and his abdominal exam today, I suspect he more than likely has abdominal wall/neuropathic pain. Discussed this with him and potential treatment options. Not sure if this is related to his other musculoskeletal pain in his right hip from prior back issues. Recommend trial of gabapentin 300 mg daily at bedtime for a week and then increase to 2 tabs daily at bedtime. Also can consider a trial of capsaicin cream to see if this helps. Otherwise he seems to have significant baseline constipation which perhaps is also related although he denies improvement in his pain with a bowel movement. We'll try MiraLAX twice a day and titrate as needed for bowel movement every day to every other day. Otherwise  it appears she has had stool based colon cancer screening will try to obtain those results from his primary care to confirm. If symptoms persist he can contact me for reassessment. He agreed  Ileene PatrickSteven Armbruster, MD [facility] Pager [phone]

## 2016-05-08 ENCOUNTER — Ambulatory Visit (INDEPENDENT_AMBULATORY_CARE_PROVIDER_SITE_OTHER): Payer: Medicare Other | Admitting: Cardiology

## 2016-05-08 ENCOUNTER — Encounter: Payer: Self-pay | Admitting: Cardiology

## 2016-05-08 VITALS — BP 106/70 | HR 94 | Ht 67.0 in | Wt 267.0 lb

## 2016-05-08 DIAGNOSIS — I482 Chronic atrial fibrillation: Secondary | ICD-10-CM

## 2016-05-08 DIAGNOSIS — I4821 Permanent atrial fibrillation: Secondary | ICD-10-CM

## 2016-05-08 NOTE — Patient Instructions (Signed)
Medication Instructions:  Your physician recommends that you continue on your current medications as directed. Please refer to the Current Medication list given to you today.  If you need a refill on your cardiac medications before your next appointment, please call your pharmacy.   Labwork: None ordered  Testing/Procedures: None ordered  Follow-Up: Your physician wants you to follow-up in: 6 months with Dr. Camnitz.  You will receive a reminder letter in the mail two months in advance. If you don't receive a letter, please call our office to schedule the follow-up appointment.  Thank you for choosing [facility]!!   Hanaan Gancarz, RN [phone]         

## 2016-05-08 NOTE — Progress Notes (Signed)
Electrophysiology Office Note   Date:  05/08/2016   ID:  Joseph Barrera, DOB [birth year], MRN [id number]  PCP:  No PCP Per Patient  Primary Electrophysiologist:  Olimpia Tinch Jorja LoaMartin Abiageal Blowe, MD    Chief Complaint  Patient presents with  . New Patient (Initial Visit)    AFIB     History of Present Illness: Joseph SeminoleRonald Charles Garate is a 82 y.o. male who presents today for electrophysiology evaluation.   History of fibrillation, TIA 2, PE/DVT, obstructive sleep apnea, reported history of CAD/MI. He reports that he had a chemical stress test less than a year ago and was told that it was okay. He has also had an echocardiogram within the last year. He has chronic lower extremity edema and wears compression stockings. He has had episodes of syncope in the past. He says that when he bends down like he is going to tie his shoes, he gets abdominal pain and a cold feeling over his lower abdomen. When he stands up he has episodes of feeling dizzy like he is going to pass out. He has passed out a few times.   Today, he denies symptoms of palpitations, chest pain, shortness of breath, orthopnea, PND, lower extremity edema, claudication, dizziness, bleeding, or neurologic sequela. The patient is tolerating medications without difficulties and is otherwise without complaint today.    Past Medical History:  Diagnosis Date  . A-fib (HCC)   . Atrial fibrillation (HCC)   . Bronchitis   . CAD (coronary artery disease)   . CHF (congestive heart failure) (HCC)   . Constipation   . Edema   . Gait disturbance   . GERD (gastroesophageal reflux disease)   . Hypertension   . Obesity   . Pulmonary embolism (HCC)   . Restless leg syndrome   . Sleep hypopnea   . TIA (transient ischemic attack)   . Urinary bladder incontinence    Past Surgical History:  Procedure Laterality Date  . ADENOIDECTOMY    . APPENDECTOMY    . CHOLECYSTECTOMY    . HIP SURGERY Left   . KNEE SURGERY Bilateral   . REPLACEMENT  TOTAL KNEE BILATERAL    . TONSILLECTOMY    . TOTAL HIP ARTHROPLASTY Left      Current Outpatient Prescriptions  Medication Sig Dispense Refill  . b complex vitamins capsule Take 1 capsule by mouth daily.    . cetirizine (ZYRTEC) 10 MG tablet Take 10 mg by mouth daily.    . Cholecalciferol (VITAMIN D3 ULTRA STRENGTH) 5000 units capsule Take 5,000 Units by mouth daily.    Marland Kitchen. dicyclomine (BENTYL) 20 MG tablet Take 20 mg by mouth every 6 (six) hours.    . furosemide (LASIX) 40 MG tablet Take 40 mg by mouth 2 (two) times daily.    Marland Kitchen. gabapentin (NEURONTIN) 300 MG capsule Take 300mg  nightly for 1 week then increase to 2 tablets nightly. 21 capsule 3  . ipratropium-albuterol (DUONEB) 0.5-2.5 (3) MG/3ML SOLN Take 3 mLs by nebulization every 6 (six) hours as needed.    . metoprolol (LOPRESSOR) 50 MG tablet Take 50 mg by mouth 3 (three) times daily.    . Multiple Vitamins-Minerals (MULTIVITAMIN ADULT) TABS Take 1 tablet by mouth daily.    Marland Kitchen. omeprazole (PRILOSEC) 20 MG capsule Take 20 mg by mouth daily.    . polyethylene glycol powder (GLYCOLAX/MIRALAX) powder Mix 17g in 8oz of water twice daily. May titrate as needed. 255 g 3  . potassium chloride SA (K-DUR,KLOR-CON) 20 MEQ tablet  Take 20 mEq by mouth 2 (two) times daily.    Marland Kitchen rOPINIRole (REQUIP) 4 MG tablet Take 4 mg by mouth at bedtime.    Marland Kitchen spironolactone (ALDACTONE) 50 MG tablet Take 50 mg by mouth 2 (two) times daily.    . vitamin B-12 (CYANOCOBALAMIN) 500 MCG tablet Take 500 mcg by mouth daily.     No current facility-administered medications for this visit.     Allergies:   Oxycodone; Benadryl [diphenhydramine]; and Oxycodone   Social History:  The patient  reports that he quit smoking about 55 years ago. His smoking use included Cigarettes. He has never used smokeless tobacco. He reports that he does not drink alcohol or use drugs.   Family History:  The patient's family history includes CVA in his father; Lead poisoning in his brother.     ROS:  Please see the history of present illness.   Otherwise, review of systems is positive for Hearing loss, visual changes, cough, dyspnea on exertion, abdominal pain, constipation, dizziness, passing out.   All other systems are reviewed and negative.    PHYSICAL EXAM: VS:  BP 106/70   Pulse 94   Ht 5\' 7"  (1.702 m)   Wt 267 lb (121.1 kg)   BMI 41.82 kg/m  , BMI Body mass index is 41.82 kg/m. GEN: Well nourished, well developed, in no acute distress  HEENT: normal  Neck: no JVD, carotid bruits, or masses Cardiac: RRR; no murmurs, rubs, or gallops, 1+ edema  Respiratory:  clear to auscultation bilaterally, normal work of breathing GI: soft, nontender, nondistended, + BS MS: no deformity or atrophy  Skin: warm and dry Neuro:  Strength and sensation are intact Psych: euthymic mood, full affect  EKG:  EKG is not ordered today. Personal review of the ekg ordered 04/17/16 shows atrial fibrillation, rate 82  Recent Labs: 04/09/2016: ALT 31; B Natriuretic Peptide 170.8; BUN 12; Creatinine, Ser 0.77; Hemoglobin 14.1; Platelets 264; Potassium 4.2; Sodium 137    Lipid Panel  No results found for: CHOL, TRIG, HDL, CHOLHDL, VLDL, LDLCALC, LDLDIRECT   Wt Readings from Last 3 Encounters:  05/08/16 267 lb (121.1 kg)  05/03/16 274 lb (124.3 kg)  04/17/16 277 lb 12 oz (126 kg)      Other studies Reviewed: Additional studies/ records that were reviewed today include: Myoview 09/2015  Review of the above records today demonstrates:  Normal EF, probably normal rest/stress SPECT   ASSESSMENT AND PLAN:  1. Atrial Fibrillation: On Coumadin with INRs are followed at ALF. Currently feeling well without any major complaints. Heart rate is relatively well controlled.  This patients CHA2DS2-VASc Score and unadjusted Ischemic Stroke Rate (% per year) is equal to 4.8 % stroke rate/year from a score of 4  Above score calculated as 1 point each if present [CHF, HTN, DM,  Vascular=MI/PAD/Aortic Plaque, Age if 65-74, or Male] Above score calculated as 2 points each if present [Age > 75, or Stroke/TIA/TE]   2. Presumed Diastolic? Systolic HF: Pt with chronic bilateral LEE, for which he takes lasix. Myoview in the system shows a normal ejection fraction. Continue Lasix.  3. ?CAD: pt reports h/o MI. He denies any stents. He had a chemical stress test within the past year. Emil Weigold obtain cardiac [facility].  4. 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   5. OSA: he reports full compliance with CPAP.    Current medicines are reviewed at length with the patient today.   The patient does not have concerns regarding  his medicines.  The following changes were made today:  none  Labs/ tests ordered today include:  No orders of the defined types were placed in this encounter.    Disposition:   FU with Jaydan Meidinger 6 months  Signed, Hendrixx Severin Jorja LoaMartin Gabriel Paulding, MD  05/08/2016 12:05 PM     [employer] [address] [place] [address] [phone] (office) [phone] (fax)

## 2016-07-10 ENCOUNTER — Ambulatory Visit: Payer: Self-pay | Admitting: Cardiology

## 2016-07-18 ENCOUNTER — Telehealth: Payer: Self-pay

## 2016-07-18 NOTE — Telephone Encounter (Signed)
[facility] [place] (fax #[phone]) for   Gabapentin 300mg  #60. Take two tabs po qhs. Number of refills requested (5)  Are you ok to refill?

## 2016-07-18 NOTE — Telephone Encounter (Signed)
Yes we can refill it it. If it is working and controlling his pain, we can give him #180 tabs (3 month supply) with 3 refills. thanks

## 2016-07-18 NOTE — Telephone Encounter (Signed)
[facility].

## 2016-08-09 ENCOUNTER — Encounter (HOSPITAL_COMMUNITY): Payer: Self-pay | Admitting: Neurology

## 2016-08-09 ENCOUNTER — Emergency Department (HOSPITAL_COMMUNITY)
Admission: EM | Admit: 2016-08-09 | Discharge: 2016-08-09 | Disposition: A | Discharge status: Home / Self Care | Payer: Medicare Other | Attending: Emergency Medicine | Admitting: Emergency Medicine

## 2016-08-09 ENCOUNTER — Emergency Department (HOSPITAL_COMMUNITY): Payer: Medicare Other

## 2016-08-09 DIAGNOSIS — Z8673 Personal history of transient ischemic attack (TIA), and cerebral infarction without residual deficits: Secondary | ICD-10-CM | POA: Diagnosis not present

## 2016-08-09 DIAGNOSIS — S79912A Unspecified injury of left hip, initial encounter: Secondary | ICD-10-CM | POA: Diagnosis present

## 2016-08-09 DIAGNOSIS — S7002XA Contusion of left hip, initial encounter: Secondary | ICD-10-CM | POA: Insufficient documentation

## 2016-08-09 DIAGNOSIS — Y929 Unspecified place or not applicable: Secondary | ICD-10-CM | POA: Diagnosis not present

## 2016-08-09 DIAGNOSIS — I2510 Atherosclerotic heart disease of native coronary artery without angina pectoris: Secondary | ICD-10-CM | POA: Insufficient documentation

## 2016-08-09 DIAGNOSIS — Y939 Activity, unspecified: Secondary | ICD-10-CM | POA: Diagnosis not present

## 2016-08-09 DIAGNOSIS — Z87891 Personal history of nicotine dependence: Secondary | ICD-10-CM | POA: Insufficient documentation

## 2016-08-09 DIAGNOSIS — Z96653 Presence of artificial knee joint, bilateral: Secondary | ICD-10-CM | POA: Diagnosis not present

## 2016-08-09 DIAGNOSIS — I509 Heart failure, unspecified: Secondary | ICD-10-CM | POA: Insufficient documentation

## 2016-08-09 DIAGNOSIS — I110 Hypertensive heart disease with heart failure: Secondary | ICD-10-CM | POA: Diagnosis not present

## 2016-08-09 DIAGNOSIS — Z79899 Other long term (current) drug therapy: Secondary | ICD-10-CM | POA: Diagnosis not present

## 2016-08-09 DIAGNOSIS — Z951 Presence of aortocoronary bypass graft: Secondary | ICD-10-CM | POA: Diagnosis not present

## 2016-08-09 DIAGNOSIS — W07XXXA Fall from chair, initial encounter: Secondary | ICD-10-CM | POA: Diagnosis not present

## 2016-08-09 DIAGNOSIS — Z7901 Long term (current) use of anticoagulants: Secondary | ICD-10-CM | POA: Insufficient documentation

## 2016-08-09 DIAGNOSIS — Y999 Unspecified external cause status: Secondary | ICD-10-CM | POA: Diagnosis not present

## 2016-08-09 DIAGNOSIS — Z96642 Presence of left artificial hip joint: Secondary | ICD-10-CM | POA: Insufficient documentation

## 2016-08-09 DIAGNOSIS — S0990XA Unspecified injury of head, initial encounter: Secondary | ICD-10-CM

## 2016-08-09 DIAGNOSIS — W19XXXA Unspecified fall, initial encounter: Secondary | ICD-10-CM

## 2016-08-09 NOTE — Discharge Instructions (Signed)
Return to the emergency department if you develop worsening pain, headache, confusion, changes in level of consciousness, or other new and concerning symptoms.  All with your orthopedic surgeon if your pain persists in the left hip longer than 1 week.

## 2016-08-09 NOTE — ED Notes (Signed)
Requesting something to eat. Given Mal[place]key sandwich and sprite. Secretary to call [facility] to take back to [facility].

## 2016-08-09 NOTE — ED Notes (Addendum)
Pt given sandwich bag & sprite per Sarah(RN), notified that [facility] was on the way to pick him up. Pt stated daughter was coming to pick him up and to cancel [facility], notified Sarah(RN). Prepared pt food at bedside, pt eating.

## 2016-08-09 NOTE — ED Notes (Signed)
Pt is waiting in wheelchair in hallway for his daughter to arrive. She is 30 mins away.

## 2016-08-09 NOTE — ED Notes (Addendum)
Disregard, [facility] his daughter is coming and she can take him back. Marylene LandAngela, RN at [facility] made aware patient will be coming back and results were normal.

## 2016-08-09 NOTE — ED Notes (Signed)
Patient transported to X-ray 

## 2016-08-09 NOTE — ED Triage Notes (Addendum)
Per ems- he rolled out of his office chair this morning. He landed on his butt, he thinks he hit his head. Is on coumadin. c/o left sided buttock pain, does have history of hip replacement. He comes from [facility], assisted living facility. 121/74, 113 HR, 122 CBG. Has towel around neck. Denies neck pain, main complaint is left hip pain.

## 2016-08-09 NOTE — ED Provider Notes (Signed)
[facility] DEPT Provider Note   CSN: [id number] Arrival date & time: 08/09/16  0907     History   Chief Complaint Chief Complaint  Patient presents with  . Fall    HPI Joseph Barrera is a 83 y.o. male.  Patient is an 83 year old male with past medical history of atrial fibrillation on Coumadin, coronary artery disease, CHF, hypertension, and obesity. He presents today for evaluation of a fall. He was sitting in his office chair at his assisted living facility when he fell backward. He is complaining of hitting his head on the floor and pain in his left hip. He has had a prior hip replacement in this joint. He denies any loss of consciousness or headache. He denies any neck pain. He denies any other injury.   The history is provided by the patient.  Fall  This is a new problem. The current episode started less than 1 hour ago. The problem occurs constantly. The problem has not changed since onset.Pertinent negatives include no chest pain, no headaches and no shortness of breath. Nothing aggravates the symptoms. Nothing relieves the symptoms. He has tried nothing for the symptoms.    Past Medical History:  Diagnosis Date  . A-fib (HCC)   . Atrial fibrillation (HCC)   . Bronchitis   . CAD (coronary artery disease)   . CHF (congestive heart failure) (HCC)   . Constipation   . Edema   . Gait disturbance   . GERD (gastroesophageal reflux disease)   . Hypertension   . Obesity   . Pulmonary embolism (HCC)   . Restless leg syndrome   . Sleep hypopnea   . TIA (transient ischemic attack)   . Urinary bladder incontinence     Patient Active Problem List   Diagnosis Date Noted  . Atrial fibrillation (HCC) 04/06/2016  . Pulmonary embolism (HCC) 04/06/2016  . Restless legs syndrome (RLS) 04/06/2016  . Sleep hypopnea 04/06/2016  . Constipation 04/06/2016  . CAD (coronary artery disease) of artery bypass graft 04/06/2016    Past Surgical History:  Procedure Laterality  Date  . ADENOIDECTOMY    . APPENDECTOMY    . CHOLECYSTECTOMY    . HIP SURGERY Left   . KNEE SURGERY Bilateral   . REPLACEMENT TOTAL KNEE BILATERAL    . TONSILLECTOMY    . TOTAL HIP ARTHROPLASTY Left        Home Medications    Prior to Admission medications   Medication Sig Start Date End Date Taking? Authorizing Provider  b complex vitamins capsule Take 1 capsule by mouth daily.    Historical Provider, MD  cetirizine (ZYRTEC) 10 MG tablet Take 10 mg by mouth daily.    Historical Provider, MD  Cholecalciferol (VITAMIN D3 ULTRA STRENGTH) 5000 units capsule Take 5,000 Units by mouth daily.    Historical Provider, MD  dicyclomine (BENTYL) 20 MG tablet Take 20 mg by mouth every 6 (six) hours.    Historical Provider, MD  furosemide (LASIX) 40 MG tablet Take 40 mg by mouth 2 (two) times daily.    Historical Provider, MD  gabapentin (NEURONTIN) 300 MG capsule Take 300mg  nightly for 1 week then increase to 2 tablets nightly. 05/03/16   Ruffin Frederick, MD  ipratropium-albuterol (DUONEB) 0.5-2.5 (3) MG/3ML SOLN Take 3 mLs by nebulization every 6 (six) hours as needed.    Historical Provider, MD  metoprolol (LOPRESSOR) 50 MG tablet Take 50 mg by mouth 3 (three) times daily.    Historical Provider, MD  Multiple Vitamins-Minerals (MULTIVITAMIN ADULT) TABS Take 1 tablet by mouth daily.    Historical Provider, MD  omeprazole (PRILOSEC) 20 MG capsule Take 20 mg by mouth daily.    Historical Provider, MD  polyethylene glycol powder (GLYCOLAX/MIRALAX) powder Mix 17g in 8oz of water twice daily. May titrate as needed. 05/03/16   Ruffin FrederickSteven Paul Armbruster, MD  potassium chloride SA (K-DUR,KLOR-CON) 20 MEQ tablet Take 20 mEq by mouth 2 (two) times daily.    Historical Provider, MD  rOPINIRole (REQUIP) 4 MG tablet Take 4 mg by mouth at bedtime.    Historical Provider, MD  spironolactone (ALDACTONE) 50 MG tablet Take 50 mg by mouth 2 (two) times daily.    Historical Provider, MD  vitamin B-12  (CYANOCOBALAMIN) 500 MCG tablet Take 500 mcg by mouth daily.    Historical Provider, MD    Family History Family History  Problem Relation Age of Onset  . CVA Father   . Lead poisoning Brother     Social History Social History  Substance Use Topics  . Smoking status: Former Smoker    Types: Cigarettes    Quit date: 1963  . Smokeless tobacco: Never Used  . Alcohol use No     Comment: Stopped in 2010     Allergies   Oxycodone; Benadryl [diphenhydramine]; and Oxycodone   Review of Systems Review of Systems  Respiratory: Negative for shortness of breath.   Cardiovascular: Negative for chest pain.  Neurological: Negative for headaches.  All other systems reviewed and are negative.    Physical Exam Updated Vital Signs BP (!) 140/103 (BP Location: Right Arm)   Pulse (!) 104   Resp 18   SpO2 94%   Physical Exam  Constitutional: He is oriented to person, place, and time. He appears well-developed and well-nourished. No distress.  HENT:  Head: Normocephalic and atraumatic.  Mouth/Throat: Oropharynx is clear and moist.  Eyes: EOM are normal. Pupils are equal, round, and reactive to light.  Neck: Normal range of motion. Neck supple.  There is no cervical spine tenderness or step-off. He has painless range of motion in all directions.  Cardiovascular: Normal rate and regular rhythm.  Exam reveals no friction rub.   No murmur heard. Pulmonary/Chest: Effort normal and breath sounds normal. No respiratory distress. He has no wheezes. He has no rales.  Abdominal: Soft. Bowel sounds are normal. He exhibits no distension. There is no tenderness.  Musculoskeletal: Normal range of motion. He exhibits no edema.  Left hip appears grossly normal. There is no obvious deformity. There is no shortening or rotation of the leg. He is able to lift leg off the bed. Pulses, motor, and sensation are intact to the left lower extremity.  Neurological: He is alert and oriented to person, place,  and time. No cranial nerve deficit. He exhibits normal muscle tone. Coordination normal.  Skin: Skin is warm and dry. He is not diaphoretic.  Nursing note and vitals reviewed.    ED Treatments / Results  Labs (all labs ordered are listed, but only abnormal results are displayed) Labs Reviewed - No data to display  EKG  EKG Interpretation None       Radiology No results found.  Procedures Procedures (including critical care time)  Medications Ordered in ED Medications - No data to display   Initial Impression / Assessment and Plan / ED Course  I have reviewed the triage vital signs and the nursing notes.  Pertinent labs & imaging results that were available during my  care of the patient were reviewed by me and considered in my medical decision making (see chart for details).  CT scan shows no intracranial bleeding and he is neurologically intact. X-rays of the left hip are negative for fracture or dislocation or hardware complication. He will be discharged, to follow-up as needed for any problems.  Final Clinical Impressions(s) / ED Diagnoses   Final diagnoses:  None    New Prescriptions New Prescriptions   No medications on file     Geoffery Lyons, MD 08/09/16 1229

## 2016-09-12 ENCOUNTER — Encounter (HOSPITAL_COMMUNITY): Payer: Self-pay | Admitting: Emergency Medicine

## 2016-09-12 ENCOUNTER — Emergency Department (HOSPITAL_COMMUNITY): Payer: Medicare Other

## 2016-09-12 ENCOUNTER — Emergency Department (HOSPITAL_COMMUNITY)
Admission: EM | Admit: 2016-09-12 | Discharge: 2016-09-12 | Disposition: A | Discharge status: Home / Self Care | Payer: Medicare Other | Attending: Emergency Medicine | Admitting: Emergency Medicine

## 2016-09-12 DIAGNOSIS — Z8673 Personal history of transient ischemic attack (TIA), and cerebral infarction without residual deficits: Secondary | ICD-10-CM | POA: Insufficient documentation

## 2016-09-12 DIAGNOSIS — I2510 Atherosclerotic heart disease of native coronary artery without angina pectoris: Secondary | ICD-10-CM | POA: Insufficient documentation

## 2016-09-12 DIAGNOSIS — Z96653 Presence of artificial knee joint, bilateral: Secondary | ICD-10-CM | POA: Diagnosis not present

## 2016-09-12 DIAGNOSIS — I110 Hypertensive heart disease with heart failure: Secondary | ICD-10-CM | POA: Diagnosis not present

## 2016-09-12 DIAGNOSIS — I509 Heart failure, unspecified: Secondary | ICD-10-CM | POA: Diagnosis not present

## 2016-09-12 DIAGNOSIS — Z96642 Presence of left artificial hip joint: Secondary | ICD-10-CM | POA: Diagnosis not present

## 2016-09-12 DIAGNOSIS — Z79899 Other long term (current) drug therapy: Secondary | ICD-10-CM | POA: Diagnosis not present

## 2016-09-12 DIAGNOSIS — R609 Edema, unspecified: Secondary | ICD-10-CM

## 2016-09-12 DIAGNOSIS — Z87891 Personal history of nicotine dependence: Secondary | ICD-10-CM | POA: Insufficient documentation

## 2016-09-12 DIAGNOSIS — R600 Localized edema: Secondary | ICD-10-CM | POA: Diagnosis not present

## 2016-09-12 LAB — COMPREHENSIVE METABOLIC PANEL
ALBUMIN: 2.9 g/dL — AB (ref 3.5–5.0)
ALK PHOS: 59 U/L (ref 38–126)
ALT: 13 U/L — ABNORMAL LOW (ref 17–63)
AST: 25 U/L (ref 15–41)
Anion gap: 7 (ref 5–15)
BILIRUBIN TOTAL: 0.8 mg/dL (ref 0.3–1.2)
BUN: 15 mg/dL (ref 6–20)
CALCIUM: 8.8 mg/dL — AB (ref 8.9–10.3)
CO2: 31 mmol/L (ref 22–32)
Chloride: 97 mmol/L — ABNORMAL LOW (ref 101–111)
Creatinine, Ser: 1.03 mg/dL (ref 0.61–1.24)
GFR calc Af Amer: >60 mL/min (ref >60)
GFR calc non Af Amer: >60 mL/min (ref >60)
GLUCOSE: 119 mg/dL — AB (ref 65–99)
Potassium: 4.2 mmol/L (ref 3.5–5.1)
SODIUM: 135 mmol/L (ref 135–145)
Total Protein: 6.9 g/dL (ref 6.5–8.1)

## 2016-09-12 LAB — CBC
HCT: 41.7 % (ref 39.0–52.0)
Hemoglobin: 13.7 g/dL (ref 13.0–17.0)
MCH: 31.4 pg (ref 26.0–34.0)
MCHC: 32.9 g/dL (ref 30.0–36.0)
MCV: 95.6 fL (ref 78.0–100.0)
PLATELETS: 238 10*3/uL (ref 150–400)
RBC: 4.36 MIL/uL (ref 4.22–5.81)
RDW: 14.1 % (ref 11.5–15.5)
WBC: 11.4 10*3/uL — ABNORMAL HIGH (ref 4.0–10.5)

## 2016-09-12 LAB — TROPONIN I

## 2016-09-12 LAB — BRAIN NATRIURETIC PEPTIDE: B Natriuretic Peptide: 54.3 pg/mL (ref 0.0–100.0)

## 2016-09-12 MED ORDER — FUROSEMIDE 10 MG/ML IJ SOLN
40.0000 mg | INTRAMUSCULAR | Status: AC
  Administered 2016-09-12: 40 mg via INTRAVENOUS
  Filled 2016-09-12: qty 4

## 2016-09-12 NOTE — ED Triage Notes (Signed)
Patient from [facility] assisted living via [facility]. Per [facility] patient sent for bilateral leg swelling. Patient takes 80 mg of lasix daily for congestive heart failure. Patient has history of afib, CHF, & myocardial infarctions.

## 2016-09-12 NOTE — ED Notes (Signed)
Attempted R medial wrist IV, was unsuccessful. Another RN attempting now.

## 2016-09-12 NOTE — ED Provider Notes (Signed)
[facility] Provider Note   CSN: [id number] Arrival date & time: 09/12/16  1122     History   Chief Complaint Chief Complaint  Patient presents with  . Leg Swelling    HPI Joseph Barrera is a 83 y.o. male.  83 year old male with extensive past medical history including atrial fibrillation, CHF, CAD, PE who presents with leg swelling. Patient reports that he has chronic bilateral leg swelling but it has been worse over the past one week. He has been compliant with Lasix and reports good urination after he takes doses. He thinks that he is up 25 pounds recently. He reports mild increase in shortness of breath recently but no chest pain. No fever, cough/cold symptoms, vomiting, or diarrhea.   The history is provided by the patient.    Past Medical History:  Diagnosis Date  . A-fib (HCC)   . Atrial fibrillation (HCC)   . Bronchitis   . CAD (coronary artery disease)   . CHF (congestive heart failure) (HCC)   . Constipation   . Edema   . Gait disturbance   . GERD (gastroesophageal reflux disease)   . Hypertension   . Obesity   . Pulmonary embolism (HCC)   . Restless leg syndrome   . Sleep hypopnea   . TIA (transient ischemic attack)   . Urinary bladder incontinence     Patient Active Problem List   Diagnosis Date Noted  . Atrial fibrillation (HCC) 04/06/2016  . Pulmonary embolism (HCC) 04/06/2016  . Restless legs syndrome (RLS) 04/06/2016  . Sleep hypopnea 04/06/2016  . Constipation 04/06/2016  . CAD (coronary artery disease) of artery bypass graft 04/06/2016    Past Surgical History:  Procedure Laterality Date  . ADENOIDECTOMY    . APPENDECTOMY    . CHOLECYSTECTOMY    . HIP SURGERY Left   . KNEE SURGERY Bilateral   . REPLACEMENT TOTAL KNEE BILATERAL    . TONSILLECTOMY    . TOTAL HIP ARTHROPLASTY Left        Home Medications    Prior to Admission medications   Medication Sig Start Date End Date Taking? Authorizing Provider  b complex  vitamins capsule Take 1 capsule by mouth daily.    Historical Provider, MD  cetirizine (ZYRTEC) 10 MG tablet Take 10 mg by mouth daily.    Historical Provider, MD  Cholecalciferol (VITAMIN D3 ULTRA STRENGTH) 5000 units capsule Take 5,000 Units by mouth daily.    Historical Provider, MD  dicyclomine (BENTYL) 20 MG tablet Take 20 mg by mouth every 6 (six) hours.    Historical Provider, MD  furosemide (LASIX) 40 MG tablet Take 40 mg by mouth 2 (two) times daily.    Historical Provider, MD  gabapentin (NEURONTIN) 300 MG capsule Take  nightly for 1 week then increase to 2 tablets nightly. 05/03/16   Ruffin Frederick, MD  ipratropium-albuterol (DUONEB) 0.5-2.5 (3) MG/3ML SOLN Take 3 mLs by nebulization every 6 (six) hours as needed.    Historical Provider, MD  metoprolol (LOPRESSOR) 50 MG tablet Take 50 mg by mouth 3 (three) times daily.    Historical Provider, MD  Multiple Vitamins-Minerals (MULTIVITAMIN ADULT) TABS Take 1 tablet by mouth daily.    Historical Provider, MD  omeprazole (PRILOSEC) 20 MG capsule Take 20 mg by mouth daily.    Historical Provider, MD  polyethylene glycol powder (GLYCOLAX/MIRALAX) powder Mix 17g in 8oz of water twice daily. May titrate as needed. 05/03/16   Ruffin Frederick, MD  potassium chloride  SA (K-DUR,KLOR-CON) 20 MEQ tablet Take 20 mEq by mouth 2 (two) times daily.    Historical Provider, MD  rOPINIRole (REQUIP) 4 MG tablet Take 4 mg by mouth at bedtime.    Historical Provider, MD  spironolactone (ALDACTONE) 50 MG tablet Take 50 mg by mouth 2 (two) times daily.    Historical Provider, MD  vitamin B-12 (CYANOCOBALAMIN) 500 MCG tablet Take 500 mcg by mouth daily.    Historical Provider, MD    Family History Family History  Problem Relation Age of Onset  . CVA Father   . Lead poisoning Brother     Social History Social History  Substance Use Topics  . Smoking status: Former Smoker    Types: Cigarettes    Quit date: 1963  . Smokeless tobacco:  Never Used  . Alcohol use No     Comment: Stopped in 2010     Allergies   Oxycodone; Benadryl [diphenhydramine]; and Oxycodone   Review of Systems Review of Systems All other systems reviewed and are negative except that which was mentioned in HPI  Physical Exam Updated Vital Signs BP (!) 98/58 (BP Location: Right Arm)   Pulse 86   Temp 97.5 F (36.4 C) (Rectal)   Resp 19   Ht  (1.803 m)   Wt 246 lb (111.6 kg)   SpO2 95%   BMI 34.31 kg/m   Physical Exam  Constitutional: He is oriented to person, place, and time. He appears well-developed and well-nourished. No distress.  HENT:  Head: Normocephalic and atraumatic.  Moist mucous membranes  Eyes: Conjunctivae are normal. Pupils are equal, round, and reactive to light.  Neck: Neck supple.  Cardiovascular: Normal rate and normal heart sounds.  An irregularly irregular rhythm present.  No murmur heard. Pulmonary/Chest:  Mildly dyspneic with mild increase WOB, crackles in bases b/l  Abdominal: Soft. Bowel sounds are normal. He exhibits no distension. There is no tenderness.  Musculoskeletal: He exhibits edema (3+ pitting edema BLE to thighs).  Neurological: He is alert and oriented to person, place, and time.  Fluent speech  Skin: Skin is warm and dry.  Psychiatric: He has a normal mood and affect. Judgment normal.  Nursing note and vitals reviewed.    ED Treatments / Results  Labs (all labs ordered are listed, but only abnormal results are displayed) Labs Reviewed  COMPREHENSIVE METABOLIC PANEL - Abnormal; Notable for the following:       Result Value   Chloride 97 (*)    Glucose, Bld 119 (*)    Calcium 8.8 (*)    Albumin 2.9 (*)    ALT 13 (*)    All other components within normal limits  CBC - Abnormal; Notable for the following:    WBC 11.4 (*)    All other components within normal limits  BRAIN NATRIURETIC PEPTIDE  TROPONIN I    EKG  EKG Interpretation None       Radiology Dg Chest 2  View  Result Date: 09/12/2016 CLINICAL DATA:  Bilateral leg swelling.  History of CHF EXAM: CHEST  2 VIEW COMPARISON:  04/09/2016 FINDINGS: Cardiomegaly. Elevation of the right hemidiaphragm is stable. Mild vascular congestion. No overt edema. Bibasilar atelectasis noted. No visible effusions. IMPRESSION: Cardiomegaly with vascular congestion. Elevated right hemidiaphragm.  Bibasilar atelectasis. Electronically Signed   By: Charlett Nose M.D.   On: 09/12/2016 12:38    Procedures Procedures (including critical care time)  Medications Ordered in ED Medications  furosemide (LASIX) injection 40 mg (40  mg Intravenous Given 09/12/16 1421)     Initial Impression / Assessment and Plan / ED Course  I have reviewed the triage vital signs and the nursing notes.  Pertinent labs & imaging results that were available during my care of the patient were reviewed by me and considered in my medical decision making (see chart for details).     PT w/ extensive PMH including CHF who p/w worsening bilateral lower extremity edema over the past one week as well as weight gain, he is compliant with his home medications. On exam he was mildly dyspneic but in no acute respiratory distress. He was stable on 2 L nasal cannula which is his home oxygen. Afebrile, BP is borderline low at 90/58. Significant bilateral pitting edema of lower extremities. Obtained above labs as well as chest x-ray.  Lab work overall reassuring with normal creatinine, BNP 54, normal troponin, unremarkable CBC. Chest x-ray shows mild vascular congestion with no overt pulmonary edema. The patient remains stable on his baseline of oxygen and denies any change in his chronic shortness of breath. I gave him an extra dose of IV Lasix here but feel that he is safe for discharge with outpatient management given that he is stable from a respiratory standpoint and his labs do not suggest severe CHF exacerbation. I reviewed his chart with note from December  of this year and his weight then was 267, today is 246. I have emphasized the importance of close PCP follow-up regarding his symptoms and I have extensively reviewed return precautions. Patient voiced understanding and was discharged in satisfactory condition.  Final Clinical Impressions(s) / ED Diagnoses   Final diagnoses:  Peripheral edema  Chronic congestive heart failure, unspecified heart failure type [facility])    New Prescriptions New Prescriptions   No medications on file     Laurence Spates, MD 09/12/16 [phone]

## 2016-09-12 NOTE — ED Notes (Signed)
EDP at bedside  

## 2016-09-12 NOTE — ED Notes (Signed)
Manual BP 98/58.  Informed Hayley, RN and Pete Glatter, Charity fundraiser.

## 2016-09-12 NOTE — Discharge Instructions (Signed)
CONTINUE YOUR MEDICATIONS AS PRESCRIBED AND KEEP CHECKING DAILY WEIGHTS. FOLLOW CLOSELY WITH YOUR PRIMARY CARE PROVIDER FOR REASSESSMENT OF YOUR SYMPTOMS. RETURN IMMEDIATELY TO ER IF YOU HAVE ANY WORSENING WEIGHT GAIN/SWELLING, ANY NEW SHORTNESS OF BREATH OR WORSENING OXYGEN REQUIREMENT.

## 2016-09-12 NOTE — ED Notes (Signed)
Patient given water per EDP approval.

## 2016-09-12 NOTE — ED Notes (Signed)
Patient transported to X-ray 

## 2016-09-12 NOTE — ED Notes (Signed)
Pt leaving department with [facility]. NAD. Report was given to [facility] RN

## 2016-09-12 NOTE — ED Notes (Signed)
MD Little aware of patient BP readings in the upper 90's low 100's prior to discharge. [facility] has been notified of need to transport. Pt denies shortness of breath/chest pain. Verbalizes understanding of discharge instructions and importance of follow up with primary care.

## 2016-09-20 ENCOUNTER — Other Ambulatory Visit: Payer: Self-pay

## 2016-09-20 ENCOUNTER — Telehealth: Payer: Self-pay

## 2016-09-20 MED ORDER — POLYETHYLENE GLYCOL 3350 17 GM/SCOOP PO POWD: ORAL | 3 refills | Status: AC

## 2016-09-20 NOTE — Telephone Encounter (Signed)
Faxed refill request for Miralax from [facility] [place] received today.  Refilled and faxed to [phone]

## 2016-09-23 ENCOUNTER — Emergency Department (HOSPITAL_COMMUNITY): Payer: Medicare Other

## 2016-09-23 ENCOUNTER — Encounter (HOSPITAL_COMMUNITY): Payer: Self-pay | Admitting: Emergency Medicine

## 2016-09-23 ENCOUNTER — Inpatient Hospital Stay (HOSPITAL_COMMUNITY)
Admission: EM | Admit: 2016-09-23 | Discharge: 2016-09-28 | DRG: 193 | Disposition: A | Discharge status: Home Health | Payer: Medicare Other | Attending: Internal Medicine | Admitting: Internal Medicine

## 2016-09-23 DIAGNOSIS — L97909 Non-pressure chronic ulcer of unspecified part of unspecified lower leg with unspecified severity: Secondary | ICD-10-CM | POA: Diagnosis present

## 2016-09-23 DIAGNOSIS — Z888 Allergy status to other drugs, medicaments and biological substances status: Secondary | ICD-10-CM | POA: Diagnosis not present

## 2016-09-23 DIAGNOSIS — Z823 Family history of stroke: Secondary | ICD-10-CM | POA: Diagnosis not present

## 2016-09-23 DIAGNOSIS — IMO0001 Reserved for inherently not codable concepts without codable children: Secondary | ICD-10-CM | POA: Diagnosis present

## 2016-09-23 DIAGNOSIS — Z885 Allergy status to narcotic agent status: Secondary | ICD-10-CM

## 2016-09-23 DIAGNOSIS — J189 Pneumonia, unspecified organism: Principal | ICD-10-CM | POA: Diagnosis present

## 2016-09-23 DIAGNOSIS — Z87891 Personal history of nicotine dependence: Secondary | ICD-10-CM

## 2016-09-23 DIAGNOSIS — I878 Other specified disorders of veins: Secondary | ICD-10-CM | POA: Diagnosis present

## 2016-09-23 DIAGNOSIS — I480 Paroxysmal atrial fibrillation: Secondary | ICD-10-CM | POA: Diagnosis present

## 2016-09-23 DIAGNOSIS — Z96653 Presence of artificial knee joint, bilateral: Secondary | ICD-10-CM | POA: Diagnosis present

## 2016-09-23 DIAGNOSIS — I361 Nonrheumatic tricuspid (valve) insufficiency: Secondary | ICD-10-CM | POA: Diagnosis not present

## 2016-09-23 DIAGNOSIS — I110 Hypertensive heart disease with heart failure: Secondary | ICD-10-CM | POA: Diagnosis present

## 2016-09-23 DIAGNOSIS — Z9981 Dependence on supplemental oxygen: Secondary | ICD-10-CM | POA: Diagnosis not present

## 2016-09-23 DIAGNOSIS — F32A Depression, unspecified: Secondary | ICD-10-CM | POA: Diagnosis present

## 2016-09-23 DIAGNOSIS — Z96642 Presence of left artificial hip joint: Secondary | ICD-10-CM | POA: Diagnosis present

## 2016-09-23 DIAGNOSIS — E662 Morbid (severe) obesity with alveolar hypoventilation: Secondary | ICD-10-CM | POA: Diagnosis present

## 2016-09-23 DIAGNOSIS — R109 Unspecified abdominal pain: Secondary | ICD-10-CM | POA: Diagnosis not present

## 2016-09-23 DIAGNOSIS — J9621 Acute and chronic respiratory failure with hypoxia: Secondary | ICD-10-CM | POA: Diagnosis present

## 2016-09-23 DIAGNOSIS — I25810 Atherosclerosis of coronary artery bypass graft(s) without angina pectoris: Secondary | ICD-10-CM | POA: Diagnosis present

## 2016-09-23 DIAGNOSIS — Z86711 Personal history of pulmonary embolism: Secondary | ICD-10-CM | POA: Diagnosis not present

## 2016-09-23 DIAGNOSIS — Z7901 Long term (current) use of anticoagulants: Secondary | ICD-10-CM

## 2016-09-23 DIAGNOSIS — G2581 Restless legs syndrome: Secondary | ICD-10-CM | POA: Diagnosis present

## 2016-09-23 DIAGNOSIS — I482 Chronic atrial fibrillation, unspecified: Secondary | ICD-10-CM | POA: Diagnosis present

## 2016-09-23 DIAGNOSIS — R1084 Generalized abdominal pain: Secondary | ICD-10-CM

## 2016-09-23 DIAGNOSIS — J13 Pneumonia due to Streptococcus pneumoniae: Secondary | ICD-10-CM | POA: Diagnosis not present

## 2016-09-23 DIAGNOSIS — K219 Gastro-esophageal reflux disease without esophagitis: Secondary | ICD-10-CM | POA: Diagnosis present

## 2016-09-23 DIAGNOSIS — I5032 Chronic diastolic (congestive) heart failure: Secondary | ICD-10-CM | POA: Diagnosis present

## 2016-09-23 DIAGNOSIS — I495 Sick sinus syndrome: Secondary | ICD-10-CM | POA: Diagnosis present

## 2016-09-23 DIAGNOSIS — I2699 Other pulmonary embolism without acute cor pulmonale: Secondary | ICD-10-CM

## 2016-09-23 DIAGNOSIS — Z9049 Acquired absence of other specified parts of digestive tract: Secondary | ICD-10-CM

## 2016-09-23 DIAGNOSIS — J9620 Acute and chronic respiratory failure, unspecified whether with hypoxia or hypercapnia: Secondary | ICD-10-CM | POA: Diagnosis not present

## 2016-09-23 DIAGNOSIS — Z22322 Carrier or suspected carrier of Methicillin resistant Staphylococcus aureus: Secondary | ICD-10-CM | POA: Diagnosis not present

## 2016-09-23 DIAGNOSIS — Z6834 Body mass index (BMI) 34.0-34.9, adult: Secondary | ICD-10-CM

## 2016-09-23 DIAGNOSIS — I2510 Atherosclerotic heart disease of native coronary artery without angina pectoris: Secondary | ICD-10-CM | POA: Diagnosis not present

## 2016-09-23 DIAGNOSIS — Z8673 Personal history of transient ischemic attack (TIA), and cerebral infarction without residual deficits: Secondary | ICD-10-CM | POA: Diagnosis not present

## 2016-09-23 DIAGNOSIS — F329 Major depressive disorder, single episode, unspecified: Secondary | ICD-10-CM | POA: Diagnosis present

## 2016-09-23 DIAGNOSIS — I455 Other specified heart block: Secondary | ICD-10-CM | POA: Diagnosis present

## 2016-09-23 DIAGNOSIS — R0602 Shortness of breath: Secondary | ICD-10-CM | POA: Diagnosis present

## 2016-09-23 DIAGNOSIS — J181 Lobar pneumonia, unspecified organism: Secondary | ICD-10-CM | POA: Diagnosis not present

## 2016-09-23 DIAGNOSIS — K5900 Constipation, unspecified: Secondary | ICD-10-CM | POA: Diagnosis present

## 2016-09-23 DIAGNOSIS — I4891 Unspecified atrial fibrillation: Secondary | ICD-10-CM | POA: Diagnosis not present

## 2016-09-23 DIAGNOSIS — E669 Obesity, unspecified: Secondary | ICD-10-CM | POA: Diagnosis not present

## 2016-09-23 LAB — I-STAT ARTERIAL BLOOD GAS, ED
Acid-Base Excess: 4 mmol/L — ABNORMAL HIGH (ref 0.0–2.0)
Bicarbonate: 29.3 mmol/L — ABNORMAL HIGH (ref 20.0–28.0)
O2 SAT: 96 %
PCO2 ART: 43.4 mmHg (ref 32.0–48.0)
TCO2: 31 mmol/L (ref 0–100)
pH, Arterial: 7.437 (ref 7.350–7.450)
pO2, Arterial: 83 mmHg (ref 83.0–108.0)

## 2016-09-23 LAB — I-STAT CG4 LACTIC ACID, ED: Lactic Acid, Venous: 1.5 mmol/L (ref 0.5–1.9)

## 2016-09-23 LAB — TROPONIN I: Troponin I: <0.03 ng/mL (ref <0.03)

## 2016-09-23 LAB — URINALYSIS, ROUTINE W REFLEX MICROSCOPIC
Bilirubin Urine: NEGATIVE
GLUCOSE, UA: NEGATIVE mg/dL
HGB URINE DIPSTICK: NEGATIVE
KETONES UR: NEGATIVE mg/dL
Leukocytes, UA: NEGATIVE
Nitrite: NEGATIVE
PROTEIN: NEGATIVE mg/dL
Specific Gravity, Urine: 1.01 (ref 1.005–1.030)
pH: 7 (ref 5.0–8.0)

## 2016-09-23 LAB — CBC WITH DIFFERENTIAL/PLATELET
BASOS ABS: 0 10*3/uL (ref 0.0–0.1)
Basophils Relative: 0 %
Eosinophils Absolute: 0.2 10*3/uL (ref 0.0–0.7)
Eosinophils Relative: 2 %
HCT: 43.7 % (ref 39.0–52.0)
Hemoglobin: 14.6 g/dL (ref 13.0–17.0)
LYMPHS PCT: 16 %
Lymphs Abs: 2.2 10*3/uL (ref 0.7–4.0)
MCH: 31.8 pg (ref 26.0–34.0)
MCHC: 33.4 g/dL (ref 30.0–36.0)
MCV: 95.2 fL (ref 78.0–100.0)
MONO ABS: 1 10*3/uL (ref 0.1–1.0)
Monocytes Relative: 7 %
NEUTROS ABS: 10.3 10*3/uL — AB (ref 1.7–7.7)
Neutrophils Relative %: 75 %
Platelets: 233 10*3/uL (ref 150–400)
RBC: 4.59 MIL/uL (ref 4.22–5.81)
RDW: 14 % (ref 11.5–15.5)
WBC: 13.7 10*3/uL — ABNORMAL HIGH (ref 4.0–10.5)

## 2016-09-23 LAB — COMPREHENSIVE METABOLIC PANEL
ALT: 11 U/L — AB (ref 17–63)
AST: 24 U/L (ref 15–41)
Albumin: 3 g/dL — ABNORMAL LOW (ref 3.5–5.0)
Alkaline Phosphatase: 65 U/L (ref 38–126)
Anion gap: 7 (ref 5–15)
BUN: 13 mg/dL (ref 6–20)
CO2: 28 mmol/L (ref 22–32)
CREATININE: 0.91 mg/dL (ref 0.61–1.24)
Calcium: 8.9 mg/dL (ref 8.9–10.3)
Chloride: 98 mmol/L — ABNORMAL LOW (ref 101–111)
GFR calc Af Amer: >60 mL/min (ref >60)
Glucose, Bld: 104 mg/dL — ABNORMAL HIGH (ref 65–99)
Potassium: 4.2 mmol/L (ref 3.5–5.1)
Sodium: 133 mmol/L — ABNORMAL LOW (ref 135–145)
TOTAL PROTEIN: 7.3 g/dL (ref 6.5–8.1)
Total Bilirubin: 0.5 mg/dL (ref 0.3–1.2)

## 2016-09-23 LAB — LIPASE, BLOOD: LIPASE: 31 U/L (ref 11–51)

## 2016-09-23 LAB — BRAIN NATRIURETIC PEPTIDE: B Natriuretic Peptide: 88.4 pg/mL (ref 0.0–100.0)

## 2016-09-23 MED ORDER — HYDROXYZINE HCL 10 MG PO TABS
10.0000 mg | ORAL_TABLET | Freq: Three times a day (TID) | ORAL | Status: DC | PRN
  Filled 2016-09-23: qty 1

## 2016-09-23 MED ORDER — TRAMADOL HCL 50 MG PO TABS: 50.0000 mg | ORAL_TABLET | Freq: Four times a day (QID) | ORAL | Status: DC | PRN

## 2016-09-23 MED ORDER — IOPAMIDOL (ISOVUE-300) INJECTION 61%
INTRAVENOUS | Status: AC
  Administered 2016-09-23: 100 mL
  Filled 2016-09-23: qty 100

## 2016-09-23 MED ORDER — VITAMIN D 1000 UNITS PO TABS
5000.0000 [IU] | ORAL_TABLET | Freq: Every day | ORAL | Status: DC
  Administered 2016-09-24 – 2016-09-28 (×3): 5000 [IU] via ORAL
  Filled 2016-09-23 (×5): qty 5

## 2016-09-23 MED ORDER — IPRATROPIUM BROMIDE 0.02 % IN SOLN: 0.5000 mg | RESPIRATORY_TRACT | Status: DC

## 2016-09-23 MED ORDER — ADULT MULTIVITAMIN W/MINERALS CH
1.0000 | ORAL_TABLET | Freq: Every day | ORAL | Status: DC
  Administered 2016-09-24 – 2016-09-27 (×2): 1 via ORAL
  Filled 2016-09-23 (×5): qty 1

## 2016-09-23 MED ORDER — CEFTRIAXONE SODIUM 1 G IJ SOLR
1.0000 g | INTRAMUSCULAR | Status: DC
  Administered 2016-09-24 – 2016-09-27 (×4): 1 g via INTRAVENOUS
  Filled 2016-09-23 (×6): qty 10

## 2016-09-23 MED ORDER — LORATADINE 10 MG PO TABS
10.0000 mg | ORAL_TABLET | Freq: Every day | ORAL | Status: DC
  Administered 2016-09-24 – 2016-09-28 (×5): 10 mg via ORAL
  Filled 2016-09-23 (×5): qty 1

## 2016-09-23 MED ORDER — POLYETHYLENE GLYCOL 3350 17 GM/SCOOP PO POWD
1.0000 | Freq: Every day | ORAL | Status: DC | PRN
  Filled 2016-09-23: qty 255

## 2016-09-23 MED ORDER — DOCUSATE SODIUM 100 MG PO CAPS
100.0000 mg | ORAL_CAPSULE | Freq: Two times a day (BID) | ORAL | Status: DC | PRN
Start: 2016-09-23 — End: 2016-09-28

## 2016-09-23 MED ORDER — METOPROLOL TARTRATE 5 MG/5ML IV SOLN
5.0000 mg | INTRAVENOUS | Status: DC | PRN
  Administered 2016-09-23: 5 mg via INTRAVENOUS
  Filled 2016-09-23: qty 5

## 2016-09-23 MED ORDER — ZOLPIDEM TARTRATE 5 MG PO TABS
5.0000 mg | ORAL_TABLET | Freq: Every evening | ORAL | Status: DC | PRN
Start: 2016-09-23 — End: 2016-09-28

## 2016-09-23 MED ORDER — GABAPENTIN 300 MG PO CAPS: 600.0000 mg | ORAL_CAPSULE | Freq: Every day | ORAL | Status: DC

## 2016-09-23 MED ORDER — CYANOCOBALAMIN 500 MCG PO TABS
500.0000 ug | ORAL_TABLET | Freq: Every day | ORAL | Status: DC
  Administered 2016-09-24 – 2016-09-28 (×3): 500 ug via ORAL
  Filled 2016-09-23 (×5): qty 1

## 2016-09-23 MED ORDER — IPRATROPIUM BROMIDE 0.02 % IN SOLN
0.5000 mg | Freq: Four times a day (QID) | RESPIRATORY_TRACT | Status: DC
  Administered 2016-09-24 (×2): 0.5 mg via RESPIRATORY_TRACT
  Filled 2016-09-23 (×2): qty 2.5

## 2016-09-23 MED ORDER — METOPROLOL TARTRATE 25 MG PO TABS: 50.0000 mg | ORAL_TABLET | Freq: Three times a day (TID) | ORAL | Status: DC

## 2016-09-23 MED ORDER — ACETAMINOPHEN 325 MG PO TABS: 650.0000 mg | ORAL_TABLET | Freq: Four times a day (QID) | ORAL | Status: DC | PRN

## 2016-09-23 MED ORDER — LEVALBUTEROL HCL 1.25 MG/0.5ML IN NEBU
1.2500 mg | INHALATION_SOLUTION | Freq: Four times a day (QID) | RESPIRATORY_TRACT | Status: DC
  Administered 2016-09-24 (×2): 1.25 mg via RESPIRATORY_TRACT
  Filled 2016-09-23 (×4): qty 0.5

## 2016-09-23 MED ORDER — B COMPLEX VITAMINS PO CAPS: 1.0000 | ORAL_CAPSULE | Freq: Every day | ORAL | Status: DC

## 2016-09-23 MED ORDER — DEXTROSE 5 % IV SOLN
500.0000 mg | Freq: Once | INTRAVENOUS | Status: AC
  Administered 2016-09-23: 500 mg via INTRAVENOUS
  Filled 2016-09-23: qty 500

## 2016-09-23 MED ORDER — PANTOPRAZOLE SODIUM 40 MG PO TBEC
40.0000 mg | DELAYED_RELEASE_TABLET | Freq: Every day | ORAL | Status: DC
  Administered 2016-09-24 – 2016-09-28 (×5): 40 mg via ORAL
  Filled 2016-09-23 (×5): qty 1

## 2016-09-23 MED ORDER — DEXTROSE 5 % IV SOLN
1.0000 g | Freq: Once | INTRAVENOUS | Status: AC
  Administered 2016-09-23: 1 g via INTRAVENOUS
  Filled 2016-09-23: qty 10

## 2016-09-23 MED ORDER — SPIRONOLACTONE 25 MG PO TABS
50.0000 mg | ORAL_TABLET | Freq: Two times a day (BID) | ORAL | Status: DC
  Administered 2016-09-23 – 2016-09-28 (×11): 50 mg via ORAL
  Filled 2016-09-23: qty 2
  Filled 2016-09-23: qty 1
  Filled 2016-09-23 (×5): qty 2
  Filled 2016-09-23 (×2): qty 1
  Filled 2016-09-23 (×2): qty 2
  Filled 2016-09-23: qty 1
  Filled 2016-09-23: qty 2
  Filled 2016-09-23 (×3): qty 1
  Filled 2016-09-23: qty 2

## 2016-09-23 MED ORDER — DICYCLOMINE HCL 20 MG PO TABS
20.0000 mg | ORAL_TABLET | Freq: Four times a day (QID) | ORAL | Status: DC
  Administered 2016-09-24 – 2016-09-28 (×18): 20 mg via ORAL
  Filled 2016-09-23 (×19): qty 1

## 2016-09-23 MED ORDER — FUROSEMIDE 20 MG PO TABS
40.0000 mg | ORAL_TABLET | Freq: Two times a day (BID) | ORAL | Status: DC
Start: 2016-09-24 — End: 2016-09-23

## 2016-09-23 MED ORDER — DM-GUAIFENESIN ER 30-600 MG PO TB12
1.0000 | ORAL_TABLET | Freq: Two times a day (BID) | ORAL | Status: DC
  Administered 2016-09-23 – 2016-09-28 (×10): 1 via ORAL
  Filled 2016-09-23 (×10): qty 1

## 2016-09-23 MED ORDER — METOPROLOL TARTRATE 50 MG PO TABS
50.0000 mg | ORAL_TABLET | Freq: Three times a day (TID) | ORAL | Status: DC
Start: 2016-09-23 — End: 2016-09-24
  Administered 2016-09-23 – 2016-09-24 (×2): 50 mg via ORAL
  Filled 2016-09-23 (×2): qty 1

## 2016-09-23 MED ORDER — LEVALBUTEROL HCL 1.25 MG/0.5ML IN NEBU: 1.2500 mg | INHALATION_SOLUTION | Freq: Four times a day (QID) | RESPIRATORY_TRACT | Status: DC

## 2016-09-23 MED ORDER — DEXTROSE 5 % IV SOLN
500.0000 mg | INTRAVENOUS | Status: DC
  Administered 2016-09-24 – 2016-09-26 (×3): 500 mg via INTRAVENOUS
  Filled 2016-09-23 (×4): qty 500

## 2016-09-23 MED ORDER — FUROSEMIDE 40 MG PO TABS
40.0000 mg | ORAL_TABLET | Freq: Two times a day (BID) | ORAL | Status: DC
  Administered 2016-09-23 – 2016-09-28 (×10): 40 mg via ORAL
  Filled 2016-09-23 (×10): qty 1

## 2016-09-23 MED ORDER — B COMPLEX-C PO TABS
1.0000 | ORAL_TABLET | Freq: Every day | ORAL | Status: DC
  Administered 2016-09-24 – 2016-09-28 (×3): 1 via ORAL
  Filled 2016-09-23 (×5): qty 1

## 2016-09-23 MED ORDER — MORPHINE SULFATE (PF) 4 MG/ML IV SOLN
2.0000 mg | INTRAVENOUS | Status: DC | PRN
  Administered 2016-09-23: 2 mg via INTRAVENOUS
  Filled 2016-09-23 (×2): qty 1

## 2016-09-23 MED ORDER — FENTANYL CITRATE (PF) 100 MCG/2ML IJ SOLN
50.0000 ug | Freq: Once | INTRAMUSCULAR | Status: AC
  Administered 2016-09-23: 50 ug via INTRAVENOUS
  Filled 2016-09-23: qty 2

## 2016-09-23 MED ORDER — ROPINIROLE HCL 1 MG PO TABS
4.0000 mg | ORAL_TABLET | Freq: Every day | ORAL | Status: DC
  Administered 2016-09-23 – 2016-09-27 (×5): 4 mg via ORAL
  Filled 2016-09-23 (×5): qty 4

## 2016-09-23 NOTE — ED Notes (Signed)
Pt unable to lay flat for the CT.

## 2016-09-23 NOTE — H&P (Signed)
History and Physical    Joseph Barrera UEA:[id number] DOB: 01/30/1934 DOA: 09/23/2016  Referring MD/NP/PA:   PCP: Patient, No Pcp Per   Patient coming from:  The patient is coming from SNF.  At baseline, pt is dependent for most of ADL.   Chief Complaint: SOB, cough and abdominal pain  HPI: Joseph Barrera is a 83 y.o. male with medical history significant of dCHF, CAD, CABG, atrial fibrillation on Coumadin, TIA, RLS, GERD, depression, chronic respiratory failure on 2 L oxygen at home, PE on Coumadin, who presents with cough, shortness distress and abdominal pain.  Patient states that he has been having shortness breath for almost a month, which has worsened in the past several days. He has cough with clear and thick mucus production, no fever or chills, no chest pain. He has runny nose or sore throat. He can barely speak in full sentence. Patient states that he has chronic intermittent abdominal pain for more than 2 years, no clear reason was identified in the past. In the past 2 days, his abdominal pain has worsened. It is diffuse, constant, 10 out of 10 in severity, nonradiating. Denies nausea, vomiting and diarrhea. He is constipated, last bowel movement was 4 days ago. Patient denies symptoms of UTI or unilateral weakness. Patient states that he has been compliant to taking Coumadin.  ED Course: pt was found to have WBC 13.7, lactate 1.50, negative troponin, BNP 88.4, lipase 31, creatinine normal, temperature 99, tachycardia, tachypnea, oxygen saturation 86% on room air, which improved to 96% on BiPAP, ABG with pH of 7.437, PCO2 43.4, PCO2 and 86 on BiPAP. Chest x-ray showed patchy airspace disease in left side, and also right hemidiaphragm elevation. Pending urinalysis. Pending CT abdomen/pelvis. Patient is admitted to stepdown as inpatient.   Review of Systems:   General: no fevers, chills, has poor appetite, has fatigue HEENT: no blurry vision, hearing changes or sore  throat Respiratory: has dyspnea, coughing, no wheezing CV: no chest pain, no palpitations GI: no nausea, vomiting, diarrhea, has abdominal pain, constipation GU: no dysuria, burning on urination, increased urinary frequency, hematuria  Ext: has leg edema Neuro: no unilateral weakness, numbness, or tingling, no vision change or hearing loss Skin: no rash. MSK: No muscle spasm, no deformity, no limitation of range of movement in spin Heme: No easy bruising.  Travel history: No recent long distant travel.  Allergy:  Allergies  Allergen Reactions  . Oxycodone Other (See Comments)    nightmares  . Benadryl [Diphenhydramine]     Past Medical History:  Diagnosis Date  . A-fib (HCC)   . Atrial fibrillation (HCC)   . Bronchitis   . CAD (coronary artery disease)   . CHF (congestive heart failure) (HCC)   . Constipation   . Edema   . Gait disturbance   . GERD (gastroesophageal reflux disease)   . Hypertension   . Obesity   . Pulmonary embolism (HCC)   . Restless leg syndrome   . Sleep hypopnea   . TIA (transient ischemic attack)   . Urinary bladder incontinence     Past Surgical History:  Procedure Laterality Date  . ADENOIDECTOMY    . APPENDECTOMY    . CHOLECYSTECTOMY    . HIP SURGERY Left   . KNEE SURGERY Bilateral   . REPLACEMENT TOTAL KNEE BILATERAL    . TONSILLECTOMY    . TOTAL HIP ARTHROPLASTY Left     Social History:  reports that he quit smoking about 55 years ago. His  smoking use included Cigarettes. He has never used smokeless tobacco. He reports that he does not drink alcohol or use drugs.  Family History:  Family History  Problem Relation Age of Onset  . CVA Father   . Lead poisoning Brother      Prior to Admission medications   Medication Sig Start Date End Date Taking? Authorizing Provider  b complex vitamins capsule Take 1 capsule by mouth daily.   Yes [provider]  cetirizine (ZYRTEC) 10 MG tablet Take 10 mg by mouth daily.   Yes  [provider]  Cholecalciferol (VITAMIN D3 ULTRA STRENGTH) 5000 units capsule Take 5,000 Units by mouth daily.   Yes [provider]  dicyclomine (BENTYL) 20 MG tablet Take 20 mg by mouth every 6 (six) hours.   Yes [provider]  docusate sodium (COLACE) 100 MG capsule Take 100 mg by mouth 2 (two) times daily as needed for mild constipation.   Yes [provider]  furosemide (LASIX) 40 MG tablet Take 40 mg by mouth 2 (two) times daily.   Yes [provider]  gabapentin (NEURONTIN) 300 MG capsule Take 300mg  nightly for 1 week then increase to 2 tablets nightly. Patient taking differently: Take 600 mg by mouth at bedtime. Take 300mg  nightly for 1 week then increase to 2 tablets nightly. 05/03/16  Yes Armbruster, Reeves Forth, MD  ipratropium-albuterol (DUONEB) 0.5-2.5 (3) MG/3ML SOLN Take 3 mLs by nebulization every 6 (six) hours as needed.   Yes [provider]  metoprolol (LOPRESSOR) 50 MG tablet Take 50 mg by mouth 3 (three) times daily.   Yes [provider]  Multiple Vitamins-Minerals (MULTIVITAMIN ADULT) TABS Take 1 tablet by mouth daily.   Yes [provider]  omeprazole (PRILOSEC) 20 MG capsule Take 20 mg by mouth daily.   Yes [provider]  OXYGEN Inhale 2 L into the lungs daily as needed (SOB).   Yes [provider]  polyethylene glycol powder (GLYCOLAX/MIRALAX) powder Mix 17g in 8oz of water twice daily. May titrate as needed. 09/20/16  Yes Armbruster, Reeves Forth, MD  potassium chloride SA (K-DUR,KLOR-CON) 20 MEQ tablet Take 20 mEq by mouth 2 (two) times daily.   Yes [provider]  rOPINIRole (REQUIP) 4 MG tablet Take 4 mg by mouth at bedtime.   Yes [provider]  spironolactone (ALDACTONE) 50 MG tablet Take 50 mg by mouth 2 (two) times daily.   Yes [provider]  traMADol (ULTRAM) 50 MG tablet Take by mouth every 6 (six) hours as needed for moderate pain.   Yes  [provider]  vitamin B-12 (CYANOCOBALAMIN) 500 MCG tablet Take 500 mcg by mouth daily.   Yes [provider]  warfarin (COUMADIN) 7.5 MG tablet Take 7.5 mg by mouth daily.   Yes [provider]    Physical Exam: Vitals:   09/23/16 2130 09/23/16 2145 09/23/16 2200 09/23/16 2215  BP: 121/83 112/82 114/81 (!) 135/98  Pulse: (!) 126 (!) 120 (!) 118   Resp: 20 19 (!) 26 20  Temp:      TempSrc:      SpO2: 96% 96% 95% 98%  Weight:      Height:       General: Not in acute distress HEENT:       Eyes: PERRL, EOMI, no scleral icterus.       ENT: No discharge from the ears and nose, no pharynx injection, no tonsillar enlargement.  Neck: No JVD, no bruit, no mass felt. Heme: No neck lymph node enlargement. Cardiac: S1/S2, RRR, No murmurs, No gallops or rubs. Respiratory: has mild rhonchi bilaterally. No rales, wheezing or rubs. GI: mildly distended, diffusely tender, no rebound pain, no organomegaly, BS present. GU: No hematuria Ext: 1+ pitting leg edema bilaterally. 2+DP/PT pulse bilaterally. Musculoskeletal: No joint deformities, No joint redness or warmth, no limitation of ROM in spin. Skin: No rashes. He has skin blisters in lower legs. Neuro: Alert, oriented X3, cranial nerves II-XII grossly intact, moves all extremities normally.  Psych: Patient is not psychotic, no suicidal or hemocidal ideation.  Labs on Admission: I have personally reviewed following labs and imaging studies  CBC:  Recent Labs Lab 09/23/16 1947  WBC 13.7*  NEUTROABS 10.3*  HGB 14.6  HCT 43.7  MCV 95.2  PLT 233   Basic Metabolic Panel:  Recent Labs Lab 09/23/16 1947  NA 133*  K 4.2  CL 98*  CO2 28  GLUCOSE 104*  BUN 13  CREATININE 0.91  CALCIUM 8.9   GFR: Estimated Creatinine Clearance: 82.2 mL/min (by C-G formula based on SCr of 0.91 mg/dL). Liver Function Tests:  Recent Labs Lab 09/23/16 1947  AST 24  ALT 11*  ALKPHOS 65  BILITOT 0.5  PROT 7.3   ALBUMIN 3.0*    Recent Labs Lab 09/23/16 1947  LIPASE 31   No results for input(s): AMMONIA in the last 168 hours. Coagulation Profile: No results for input(s): INR, PROTIME in the last 168 hours. Cardiac Enzymes:  Recent Labs Lab 09/23/16 1947  TROPONINI <0.03   BNP (last 3 results) No results for input(s): PROBNP in the last 8760 hours. HbA1C: No results for input(s): HGBA1C in the last 72 hours. CBG: No results for input(s): GLUCAP in the last 168 hours. Lipid Profile: No results for input(s): CHOL, HDL, LDLCALC, TRIG, CHOLHDL, LDLDIRECT in the last 72 hours. Thyroid Function Tests: No results for input(s): TSH, T4TOTAL, FREET4, T3FREE, THYROIDAB in the last 72 hours. Anemia Panel: No results for input(s): VITAMINB12, FOLATE, FERRITIN, TIBC, IRON, RETICCTPCT in the last 72 hours. Urine analysis:    Component Value Date/Time   COLORURINE YELLOW 09/23/2016 2112   APPEARANCEUR CLEAR 09/23/2016 2112   LABSPEC 1.010 09/23/2016 2112   PHURINE 7.0 09/23/2016 2112   GLUCOSEU NEGATIVE 09/23/2016 2112   HGBUR NEGATIVE 09/23/2016 2112   BILIRUBINUR NEGATIVE 09/23/2016 2112   KETONESUR NEGATIVE 09/23/2016 2112   PROTEINUR NEGATIVE 09/23/2016 2112   NITRITE NEGATIVE 09/23/2016 2112   LEUKOCYTESUR NEGATIVE 09/23/2016 2112   Sepsis Labs: @LABRCNTIP (procalcitonin:4,lacticidven:4) ) Recent Results (from the past 240 hour(s))  Blood culture (routine x 2)     Status: None (Preliminary result)   Collection Time: 09/23/16  9:21 PM  Result Value Ref Range Status   Specimen Description BLOOD LEFT HAND  Final   Special Requests   Final    BOTTLES DRAWN AEROBIC AND ANAEROBIC Blood Culture adequate volume   Culture PENDING  Incomplete   Report Status PENDING  Incomplete  Blood culture (routine x 2)     Status: None (Preliminary result)   Collection Time: 09/23/16  9:21 PM  Result Value Ref Range Status   Specimen Description BLOOD RIGHT WRIST  Final   Special Requests   Final     BOTTLES DRAWN AEROBIC AND ANAEROBIC Blood Culture adequate volume   Culture PENDING  Incomplete   Report Status PENDING  Incomplete     Radiological Exams on Admission: Dg Chest Portable 1 View  Result Date: 09/23/2016 CLINICAL DATA:  SOB and entire abdominal pain. No chest pain. Also reports coughing up phlegm that's been going on for months. EXAM: PORTABLE CHEST 1 VIEW COMPARISON:  09/12/2016 FINDINGS: Mildly degraded exam due to AP portable technique and patient body habitus. Numerous leads and wires project over the chest. Midline trachea. Cardiomegaly accentuated by AP portable technique. Moderate right hemidiaphragm elevation. No pleural effusion or pneumothorax. Extremely low lung volumes, with resultant pulmonary interstitial prominence. Patchy left mid and lower lung pulmonary opacities are similar to the 04/09/2016 exam and likely a present on 09/12/2016. IMPRESSION: Cardiomegaly with persistent or recurrent patchy left-sided airspace disease. Suspicious for pneumonia. Chronic scarring or even calcified pleural plaques could have a similar appearance. Cardiomegaly with right hemidiaphragm elevation and low lung volumes. No congestive failure. Electronically Signed   By: Jeronimo GreavesKyle  Talbot M.D.   On: 09/23/2016 20:16     EKG: Independently reviewed. QTc 503, tachycardia, atrial fibrillation, LAD.  Assessment/Plan Principal Problem:   CAP (community acquired pneumonia) Active Problems:   Atrial fibrillation with RVR (HCC)   Pulmonary embolism (HCC)   CAD (coronary artery disease) of artery bypass graft   Acute on chronic respiratory failure with hypoxia (HCC)   Abdominal pain   GERD (gastroesophageal reflux disease)   Depression   Chronic diastolic CHF (congestive heart failure) (HCC)   Acute on chronic respiratory failure with hypoxia due to CAP: Patient has productive cough, SOB, leukocytosis, plus chest x-ray findings of patchy airspace disease in the left side, consistent with  CPAP. His BNP is normal, no pulmonary edema on chest x-ray, indicating no acute CHF exacerbation. Patient has been compliant to Coumadin and no any chest pain, less likely to have new PE. ABG with pH of 7.437, PCO2 43.4, PCO2 and 86 on BiPAP. Lactate is normal. Clinically nonseptic.  - Will admit to SDU as inpt -Continue BiPAP - IV Rocephin and azithromycin - Mucinex for cough  - Atrovent nebs, Xopenex Neb prn for SOB - Urine legionella and S. pneumococcal antigen - Follow up blood culture x2, sputum culture and plus Flu pcr  Atrial fibrillation with RVR: CHA2DS2-VASc Score is 6, needs oral anticoagulation. Patient is on Coumadin at home. INR is pending on admission. Heart rate is 120-130. -will continue coumadin per pharm -continue home metoprolol -When necessary IV metoprolol for heart rate>120  Pulmonary embolism (HCC): no CP -continue Coumadin per pharm  CAD (coronary artery disease) of artery bypass graft: No CP. Troponin negative in ED -Continue metoprolol  Chronic diastolic CHF: Patient has 1+ leg edema, but no JVD. BNP is normal. Chest x-ray has no pulmonary edema. CHF seems to be compensated. He has skin blisters in lower legs. -Continue home Lasix and spironolactone -Continue metoprolol -Wound care consult for lower leg blister  GERD: -Protonix  Depression: Stable, no suicidal or homicidal ideations. -Continue home medications: Requip  Abdominal pain: Etiology is not clear. Lipase normal. May be due to constipation, but still need to rule out other possibilities. -f/u CT-abdomen/pelvis which is ordered by EDP physician -When necessary morphine for pain -When necessary hydroxyzine for nausea -Continue home tramadol -Continue MiraLAX, colace    DVT ppx: on Coumain Code Status: Full code Family Communication: None at bed side.  Disposition Plan:  Anticipate discharge back to previous SNF environment Consults called:  none Admission status: SDU/inpation        Date of Service 09/23/2016    Lorretta HarpIU, Khadeja Abt [facility] Pager [phone]  If 7PM-7AM, please contact night-coverage [email or web address] Password  [email or web address] 09/23/2016, 10:42 PM

## 2016-09-23 NOTE — ED Triage Notes (Signed)
Brought by ems from home for c/o diffuse abdominal pain for the last two days.  Denies any N/V/D.  Reports having this pain on and off for 2 years but it usually lasts a few hours then will go away.  Noted to be SOB in triage.  Wears )2 at 2 1/2 liters [place] at home.  Also reports coughing up phlegm that's been going on for months.  Denies any fevers.

## 2016-09-23 NOTE — ED Provider Notes (Signed)
[facility]-EMERGENCY DEPT Provider Note   CSN: [id number] Arrival date & time: 09/23/16  1939     History   Chief Complaint Chief Complaint  Patient presents with  . Abdominal Pain    HPI Joseph Barrera is a 83 y.o. male.  The history is provided by the patient.  Shortness of Breath  This is a new problem. The average episode lasts 2 days. The problem occurs continuously.Episode onset: for about 1 month. The problem has been rapidly worsening. Associated symptoms include cough, sputum production, abdominal pain and leg swelling. Pertinent negatives include no fever. He has tried nothing for the symptoms. He has had prior hospitalizations. He has had prior ED visits.    Past Medical History:  Diagnosis Date  . A-fib (HCC)   . Atrial fibrillation (HCC)   . Bronchitis   . CAD (coronary artery disease)   . CHF (congestive heart failure) (HCC)   . Constipation   . Edema   . Gait disturbance   . GERD (gastroesophageal reflux disease)   . Hypertension   . Obesity   . Pulmonary embolism (HCC)   . Restless leg syndrome   . Sleep hypopnea   . TIA (transient ischemic attack)   . Urinary bladder incontinence     Patient Active Problem List   Diagnosis Date Noted  . Atrial fibrillation (HCC) 04/06/2016  . Pulmonary embolism (HCC) 04/06/2016  . Restless legs syndrome (RLS) 04/06/2016  . Sleep hypopnea 04/06/2016  . Constipation 04/06/2016  . CAD (coronary artery disease) of artery bypass graft 04/06/2016    Past Surgical History:  Procedure Laterality Date  . ADENOIDECTOMY    . APPENDECTOMY    . CHOLECYSTECTOMY    . HIP SURGERY Left   . KNEE SURGERY Bilateral   . REPLACEMENT TOTAL KNEE BILATERAL    . TONSILLECTOMY    . TOTAL HIP ARTHROPLASTY Left        Home Medications    Prior to Admission medications   Medication Sig Start Date End Date Taking? Authorizing Provider  b complex vitamins capsule Take 1 capsule by mouth daily.    [provider]    cetirizine (ZYRTEC) 10 MG tablet Take 10 mg by mouth daily.    [provider]  Cholecalciferol (VITAMIN D3 ULTRA STRENGTH) 5000 units capsule Take 5,000 Units by mouth daily.    [provider]  dicyclomine (BENTYL) 20 MG tablet Take 20 mg by mouth every 6 (six) hours.    [provider]  furosemide (LASIX) 40 MG tablet Take 40 mg by mouth 2 (two) times daily.    [provider]  gabapentin (NEURONTIN) 300 MG capsule Take 300mg  nightly for 1 week then increase to 2 tablets nightly. 05/03/16   Armbruster, Reeves Forth, MD  ipratropium-albuterol (DUONEB) 0.5-2.5 (3) MG/3ML SOLN Take 3 mLs by nebulization every 6 (six) hours as needed.    [provider]  metoprolol (LOPRESSOR) 50 MG tablet Take 50 mg by mouth 3 (three) times daily.    [provider]  Multiple Vitamins-Minerals (MULTIVITAMIN ADULT) TABS Take 1 tablet by mouth daily.    [provider]  omeprazole (PRILOSEC) 20 MG capsule Take 20 mg by mouth daily.    [provider]  polyethylene glycol powder (GLYCOLAX/MIRALAX) powder Mix 17g in 8oz of water twice daily. May titrate as needed. 09/20/16   Armbruster, Reeves Forth, MD  potassium chloride SA (K-DUR,KLOR-CON) 20 MEQ tablet Take 20 mEq by mouth 2 (two) times daily.  [provider]  rOPINIRole (REQUIP) 4 MG tablet Take 4 mg by mouth at bedtime.    [provider]  spironolactone (ALDACTONE) 50 MG tablet Take 50 mg by mouth 2 (two) times daily.    [provider]  vitamin B-12 (CYANOCOBALAMIN) 500 MCG tablet Take 500 mcg by mouth daily.    [provider]    Family History Family History  Problem Relation Age of Onset  . CVA Father   . Lead poisoning Brother     Social History Social History  Substance Use Topics  . Smoking status: Former Smoker    Types: Cigarettes    Quit date: 1963  . Smokeless tobacco: Never Used  . Alcohol use No     Comment: Stopped in 2010      Allergies   Oxycodone; Benadryl [diphenhydramine]; and Oxycodone   Review of Systems Review of Systems  Constitutional: Negative for fever.  HENT: Negative.   Respiratory: Positive for cough, sputum production and shortness of breath.   Cardiovascular: Positive for leg swelling.  Gastrointestinal: Positive for abdominal pain.  Genitourinary: Negative.   All other systems reviewed and are negative.    Physical Exam Updated Vital Signs Ht 5\' 11"  (1.803 m)   Wt 111.6 kg   SpO2 (!) 86% Comment: on 4L  BMI 34.31 kg/m  ED Triage Vitals  Enc Vitals Group     BP 09/23/16 1945 114/85     Pulse Rate 09/23/16 1945 98     Resp 09/23/16 1945 (!) 23     Temp 09/23/16 2046 97.7 F (36.5 C)     Temp Source 09/23/16 2046 Oral     SpO2 09/23/16 1941 (!) 86 %     Weight 09/23/16 1943 246 lb (111.6 kg)     Height 09/23/16 1943 5\' 11"  (1.803 m)     Head Circumference --      Peak Flow --      Pain Score 09/23/16 1941 10     Pain Loc --      Pain Edu? --      Excl. in GC? --     Physical Exam  Constitutional: He is oriented to person, place, and time. He appears well-developed and well-nourished. He appears distressed.  Chronically ill appearing male  HENT:  Head: Normocephalic and atraumatic.  Mouth/Throat: Oropharynx is clear and moist.  Eyes: EOM are normal.  Neck: Normal range of motion. Neck supple.  Cardiovascular: Normal heart sounds and intact distal pulses.   Tachy and irreg irreg  Pulmonary/Chest: He is in respiratory distress.  Increased effort Rales left base  Abdominal: Soft. There is tenderness (diffusely). There is no guarding.  Musculoskeletal: He exhibits edema. He exhibits no deformity.  Neurological: He is alert and oriented to person, place, and time. He exhibits normal muscle tone. Coordination normal.  Skin: He is not diaphoretic. No pallor.  Chronic wounds and venous stasis changes to LE  Psychiatric: He has a normal mood and affect.  Nursing note  and vitals reviewed.    ED Treatments / Results  Labs (all labs ordered are listed, but only abnormal results are displayed) Labs Reviewed - No data to display  EKG  EKG Interpretation None       Radiology No results found.  Procedures Procedures (including critical care time)  Medications Ordered in ED Medications - No data to display   Initial Impression / Assessment and Plan / ED Course  I have reviewed the triage vital signs  and the nursing notes.  Pertinent labs & imaging results that were available during my care of the patient were reviewed by me and considered in my medical decision making (see chart for details).     Patient is a 83 year old male with a complex medical history including heart failure, CAD, CABG, A. fib on Coumadin as well as 2 L baseline nasal cannula who presents with abdominal pain, shortness of breath, cough. This has been worsening for about a month but as much worsened over the past few days. He is requiring 4-5 L initially but otherwise has reassuring vital signs. He does report baseline abdominal pain which she states does not have a known cause despite a significant workup. Initial labs with leukocytosis as well as chest x-ray which shows possible left-sided pneumonia. Patient given community acquired pneumonia coverage. Attempted to order CT abdomen and pelvis since he states he is not had a bowel movement in 4 days but patient is not tolerating laying flat. We'll initiate BiPAP for shortness of breath. Given need for BiPAP he be admitted to stepdown unit.  Final Clinical Impressions(s) / ED Diagnoses   Final diagnoses:  None    New Prescriptions New Prescriptions   No medications on file     Marijean Niemann, MD 09/24/16 Barnie Mort    Blane Ohara, MD 09/30/16 1610    Blane Ohara, MD 09/30/16 [phone]

## 2016-09-23 NOTE — ED Notes (Signed)
Patient transported to CT 

## 2016-09-24 ENCOUNTER — Inpatient Hospital Stay (HOSPITAL_COMMUNITY): Payer: Medicare Other

## 2016-09-24 DIAGNOSIS — I509 Heart failure, unspecified: Secondary | ICD-10-CM

## 2016-09-24 DIAGNOSIS — E669 Obesity, unspecified: Secondary | ICD-10-CM

## 2016-09-24 DIAGNOSIS — I878 Other specified disorders of veins: Secondary | ICD-10-CM

## 2016-09-24 DIAGNOSIS — I25810 Atherosclerosis of coronary artery bypass graft(s) without angina pectoris: Secondary | ICD-10-CM

## 2016-09-24 DIAGNOSIS — J9621 Acute and chronic respiratory failure with hypoxia: Secondary | ICD-10-CM

## 2016-09-24 DIAGNOSIS — J9620 Acute and chronic respiratory failure, unspecified whether with hypoxia or hypercapnia: Secondary | ICD-10-CM

## 2016-09-24 DIAGNOSIS — R0902 Hypoxemia: Secondary | ICD-10-CM

## 2016-09-24 DIAGNOSIS — IMO0001 Reserved for inherently not codable concepts without codable children: Secondary | ICD-10-CM | POA: Diagnosis present

## 2016-09-24 DIAGNOSIS — Z22322 Carrier or suspected carrier of Methicillin resistant Staphylococcus aureus: Secondary | ICD-10-CM

## 2016-09-24 DIAGNOSIS — I872 Venous insufficiency (chronic) (peripheral): Secondary | ICD-10-CM

## 2016-09-24 DIAGNOSIS — J181 Lobar pneumonia, unspecified organism: Secondary | ICD-10-CM

## 2016-09-24 DIAGNOSIS — I455 Other specified heart block: Secondary | ICD-10-CM

## 2016-09-24 DIAGNOSIS — J13 Pneumonia due to Streptococcus pneumoniae: Secondary | ICD-10-CM

## 2016-09-24 LAB — MRSA PCR SCREENING: MRSA by PCR: POSITIVE — AB

## 2016-09-24 LAB — PROTIME-INR
INR: 1.08
PROTHROMBIN TIME: 14.1 s (ref 11.4–15.2)

## 2016-09-24 LAB — INFLUENZA PANEL BY PCR (TYPE A & B)
INFLAPCR: NEGATIVE
INFLBPCR: NEGATIVE

## 2016-09-24 LAB — GLUCOSE, CAPILLARY: GLUCOSE-CAPILLARY: 109 mg/dL — AB (ref 65–99)

## 2016-09-24 LAB — STREP PNEUMONIAE URINARY ANTIGEN: STREP PNEUMO URINARY ANTIGEN: NEGATIVE

## 2016-09-24 MED ORDER — HYDROCODONE-ACETAMINOPHEN 5-325 MG PO TABS: 1.0000 | ORAL_TABLET | ORAL | Status: DC | PRN

## 2016-09-24 MED ORDER — ENOXAPARIN SODIUM 120 MG/0.8ML ~~LOC~~ SOLN
120.0000 mg | SUBCUTANEOUS | Status: AC
  Administered 2016-09-24: 120 mg via SUBCUTANEOUS
  Filled 2016-09-24: qty 0.8

## 2016-09-24 MED ORDER — METOPROLOL TARTRATE 5 MG/5ML IV SOLN
2.5000 mg | Freq: Once | INTRAVENOUS | Status: AC
  Administered 2016-09-24: 2.5 mg via INTRAVENOUS
  Filled 2016-09-24: qty 5

## 2016-09-24 MED ORDER — LEVALBUTEROL HCL 1.25 MG/0.5ML IN NEBU: 1.2500 mg | INHALATION_SOLUTION | Freq: Four times a day (QID) | RESPIRATORY_TRACT | Status: DC | PRN

## 2016-09-24 MED ORDER — BISACODYL 10 MG RE SUPP
10.0000 mg | Freq: Once | RECTAL | Status: AC
  Administered 2016-09-24: 10 mg via RECTAL
  Filled 2016-09-24: qty 1

## 2016-09-24 MED ORDER — KETOROLAC TROMETHAMINE 15 MG/ML IJ SOLN
15.0000 mg | Freq: Four times a day (QID) | INTRAMUSCULAR | Status: DC | PRN
  Filled 2016-09-24: qty 1

## 2016-09-24 MED ORDER — CHLORHEXIDINE GLUCONATE CLOTH 2 % EX PADS
6.0000 | MEDICATED_PAD | Freq: Every day | CUTANEOUS | Status: DC
  Administered 2016-09-24 – 2016-09-27 (×4): 6 via TOPICAL

## 2016-09-24 MED ORDER — ENOXAPARIN SODIUM 120 MG/0.8ML ~~LOC~~ SOLN
120.0000 mg | Freq: Two times a day (BID) | SUBCUTANEOUS | Status: DC
  Administered 2016-09-24 – 2016-09-28 (×8): 120 mg via SUBCUTANEOUS
  Filled 2016-09-24 (×9): qty 0.8

## 2016-09-24 MED ORDER — METOPROLOL TARTRATE 25 MG PO TABS
25.0000 mg | ORAL_TABLET | Freq: Three times a day (TID) | ORAL | Status: DC
  Administered 2016-09-25 – 2016-09-28 (×9): 25 mg via ORAL
  Filled 2016-09-24 (×10): qty 1

## 2016-09-24 MED ORDER — WARFARIN SODIUM 10 MG PO TABS
10.0000 mg | ORAL_TABLET | Freq: Once | ORAL | Status: AC
Start: 2016-09-24 — End: 2016-09-24
  Administered 2016-09-24: 10 mg via ORAL
  Filled 2016-09-24: qty 1

## 2016-09-24 MED ORDER — MORPHINE SULFATE (PF) 4 MG/ML IV SOLN
1.0000 mg | INTRAVENOUS | Status: DC | PRN
  Administered 2016-09-24 (×2): 1 mg via INTRAVENOUS
  Filled 2016-09-24 (×2): qty 1

## 2016-09-24 MED ORDER — MUPIROCIN 2 % EX OINT
1.0000 "application " | TOPICAL_OINTMENT | Freq: Two times a day (BID) | CUTANEOUS | Status: DC
  Administered 2016-09-24 – 2016-09-28 (×9): 1 via NASAL
  Filled 2016-09-24 (×3): qty 22

## 2016-09-24 MED ORDER — IOPAMIDOL (ISOVUE-300) INJECTION 61%
INTRAVENOUS | Status: AC
  Administered 2016-09-24
  Filled 2016-09-24: qty 100

## 2016-09-24 MED ORDER — IPRATROPIUM BROMIDE 0.02 % IN SOLN: 0.5000 mg | Freq: Four times a day (QID) | RESPIRATORY_TRACT | Status: DC | PRN

## 2016-09-24 MED ORDER — GABAPENTIN 300 MG PO CAPS
600.0000 mg | ORAL_CAPSULE | Freq: Every day | ORAL | Status: DC
  Administered 2016-09-24 – 2016-09-27 (×5): 600 mg via ORAL
  Filled 2016-09-24 (×5): qty 2

## 2016-09-24 MED ORDER — WARFARIN - PHARMACIST DOSING INPATIENT
Freq: Every day | Status: DC
  Administered 2016-09-26 – 2016-09-27 (×2)

## 2016-09-24 NOTE — Progress Notes (Signed)
Pt had >6 sec pause on tele monitor. MD paged but not return phone call at this time. Will continue to monitor.

## 2016-09-24 NOTE — Progress Notes (Signed)
Patient returned from CT scan.

## 2016-09-24 NOTE — Progress Notes (Addendum)
Progress Note    Joseph Barrera  ZOX:[id number] DOB: [birth year]  DOA: 09/23/2016 PCP: Patient, No Pcp Per    Brief Narrative:   Chief complaint: F/U SOB, cough and abdominal pain.  Medical records reviewed and are as summarized below:  Joseph Barrera is an 83 y.o. male with a PMH of chronic diastolic CHF, CAD status post CABG, atrial fibrillation on chronic Coumadin, history of TIA, RLS, GERD, depression, chronic respiratory failure on 2 L of supplemental oxygen at home, history pulmonary embolism who was admitted 09/23/16 with a chief complaint of worsening dyspnea over the course of one month, new onset of cough with clear/thick mucus, and a 2 year history of chronic intermittent abdominal pain that has gotten worse recently. He was found to be hypoxic with an oxygen saturation of 86% on admission and was placed on BiPAP. Chest x-ray showed patchy airspace disease on the left. Admitted to the SDU for further evaluation and treatment.  Assessment/Plan:   Principal Problem:   CAP (community acquired pneumonia) complicated by acute on chronic respiratory failure with hypoxia Patient required BiPAP overnight. Placed on empiric Rocephin/azithromycin. Placed on Mucinex and bronchodilators. Blood cultures, sputum cultures, urinary Legionella and strep pneumonia antigens, and influenza panel obtained with results currently pending. Lactic acid is not elevated. Chest x-ray personally reviewed and shows cardiomegaly with left-sided airspace disease.  Active Problems:   Atrial fibrillation with RVR (HCC)/6 sec pauses x 2 CHA2DS2-VASc Score is 6. Heart rate currently controlled on metoprolol, but had to 6 second pauses on telemetry today, so will hold metoprolol for now. If he continues to have pauses, will consult with cardiology. INR subtherapeutic. Coumadin per pharmacy.    History of Pulmonary embolism (HCC) Continue Coumadin per pharmacy.    CAD (coronary artery disease) of artery  bypass graft Troponin 0.03. Continue metoprolol.    Abdominal pain Possibly from constipation. Lipase WNL. CT of the abdomen and pelvis negative for acute findings other than bladder wall thickening. Pain may be from this, constipation, or referred pain from degenerative disc disease or cystic changes in the right ischium/ileum. Morphine ineffective, will add Toradol as needed and low dose hydrocodone. Patient has had this pain for 2 years, and has become a bit irritable with the nurses that we have not figured out what is wrong. He also seems to think he needs surgery for this.    GERD (gastroesophageal reflux disease) Continue Protonix.    Depression Continue Requip.    Chronic diastolic CHF (congestive heart failure) (HCC) No pulmonary edema noted on chest radiography. Appears to be compensated. Continue home dose of Lasix, spironolactone and metoprolol.    Obesity, Class II, BMI 35.0-39.9, with comorbidity (see actual BMI) Body mass index is 36.32 kg/m.     MRSA carrier Decontamination therapy ordered.    Chronic venous stasis ulcers Wound care nurse removed no boots, blisters have healed and there are no other open wounds or edema, so no boots have been discontinued.   Family Communication/Anticipated D/C date and plan/Code Status   DVT prophylaxis: Coumadin ordered. Code Status: Full Code.  Family Communication: No family currently at the bedside. Disposition Plan: From SNF.   Medical Consultants:    None.   Procedures:    None  Anti-Infectives:    Rocephin 09/23/16--->  Azithromycin 09/23/16--->  Subjective:   Still feeling a bit short of breath. No current complaints of chest pain. Mildly anxious. Wants to eat. When asked where he is having abdominal pain,  points to the suprapubic and lower abdominal area.  Objective:    Vitals:   09/24/16 0034 09/24/16 0102 09/24/16 0323 09/24/16 0455  BP:   (!) 94/59   Pulse: (!) 101  79 75  Resp: (!) 27  (!) 22  (!) 22  Temp:   98 F (36.7 C)   TempSrc:   Axillary   SpO2: 95% 97% 95% 96%  Weight:      Height:        Intake/Output Summary (Last 24 hours) at 09/24/16 0729 Last data filed at 09/23/16 2300  Gross per 24 hour  Intake              300 ml  Output                0 ml  Net              300 ml   Filed Weights   09/23/16 1943 09/23/16 2330  Weight: 111.6 kg (246 lb) 118.1 kg (260 lb 6.4 oz)    Exam: General exam: Appears calm and Mildly anxious. Bipap on. Respiratory system: Tachypneic, bipap on, occasional rhonchi. Cardiovascular system: Heart sounds irregular. No JVD,  rubs, gallops or clicks. I/VI murmur. Gastrointestinal system: Abdomen is obese, soft and nontender. No organomegaly or masses felt. Normal bowel sounds heard. Central nervous system: Alert and oriented. No focal neurological deficits. Extremities: No clubbing,  or cyanosis. + thigh edema. No boots in place. Skin: No rashes, lesions or ulcers. Psychiatry: Judgement and insight appear diminished. Mood & affect appropriate.   Data Reviewed:   I have personally reviewed following labs and imaging studies:  Labs: Basic Metabolic Panel:  Recent Labs Lab 09/23/16 1947  NA 133*  K 4.2  CL 98*  CO2 28  GLUCOSE 104*  BUN 13  CREATININE 0.91  CALCIUM 8.9   GFR Estimated Creatinine Clearance: 84.6 mL/min (by C-G formula based on SCr of 0.91 mg/dL). Liver Function Tests:  Recent Labs Lab 09/23/16 1947  AST 24  ALT 11*  ALKPHOS 65  BILITOT 0.5  PROT 7.3  ALBUMIN 3.0*    Recent Labs Lab 09/23/16 1947  LIPASE 31   No results for input(s): AMMONIA in the last 168 hours. Coagulation profile  Recent Labs Lab 09/24/16 0026  INR 1.08    CBC:  Recent Labs Lab 09/23/16 1947  WBC 13.7*  NEUTROABS 10.3*  HGB 14.6  HCT 43.7  MCV 95.2  PLT 233   Cardiac Enzymes:  Recent Labs Lab 09/23/16 1947  TROPONINI <0.03   Sepsis Labs:  Recent Labs Lab 09/23/16 1947 09/23/16 2008  WBC  13.7*  --   LATICACIDVEN  --  1.50    Microbiology Recent Results (from the past 240 hour(s))  Blood culture (routine x 2)     Status: None (Preliminary result)   Collection Time: 09/23/16  9:21 PM  Result Value Ref Range Status   Specimen Description BLOOD LEFT HAND  Final   Special Requests   Final    BOTTLES DRAWN AEROBIC AND ANAEROBIC Blood Culture adequate volume   Culture PENDING  Incomplete   Report Status PENDING  Incomplete  Blood culture (routine x 2)     Status: None (Preliminary result)   Collection Time: 09/23/16  9:21 PM  Result Value Ref Range Status   Specimen Description BLOOD RIGHT WRIST  Final   Special Requests   Final    BOTTLES DRAWN AEROBIC AND ANAEROBIC Blood Culture adequate volume  Culture PENDING  Incomplete   Report Status PENDING  Incomplete  MRSA PCR Screening     Status: Abnormal   Collection Time: 09/23/16 11:02 PM  Result Value Ref Range Status   MRSA by PCR POSITIVE (A) NEGATIVE Final    Comment:        The GeneXpert MRSA Assay (FDA approved for NASAL specimens only), is one component of a comprehensive MRSA colonization surveillance program. It is not intended to diagnose MRSA infection nor to guide or monitor treatment for MRSA infections. RESULT CALLED TO, READ BACK BY AND VERIFIED WITH: CORO,J RN [phone] 09/24/16 MITCHELL,L     Radiology: Ct Abdomen Pelvis W Contrast  Result Date: 09/24/2016 CLINICAL DATA:  Acute onset of generalized abdominal pain and shortness of breath. Initial encounter. EXAM: CT ABDOMEN AND PELVIS WITH CONTRAST TECHNIQUE: Multidetector CT imaging of the abdomen and pelvis was performed using the standard protocol following bolus administration of intravenous contrast. CONTRAST:  ISOVUE-300 IOPAMIDOL (ISOVUE-300) INJECTION 61% COMPARISON:  CT of the abdomen and pelvis from 04/09/2016 FINDINGS: Lower chest: Mild bibasilar atelectasis or scarring is noted. The visualized portions of the mediastinum are grossly  unremarkable. Hepatobiliary: Scattered calcified granulomata are noted within the liver. The liver is otherwise unremarkable. The patient is status post cholecystectomy, with clips noted at the gallbladder fossa. The common bile duct remains normal in caliber. Pancreas: The pancreas is within normal limits. Spleen: Scattered calcified granulomata are seen within the spleen. Adrenals/Urinary Tract: The adrenal glands are grossly unremarkable in appearance. Nonspecific perinephric stranding is noted bilaterally. A small right renal cyst is noted. There is no evidence of hydronephrosis. No renal or ureteral stones are identified. Stomach/Bowel: The stomach is unremarkable in appearance. The small bowel is within normal limits. The patient is status post appendectomy. The colon is unremarkable in appearance. Vascular/Lymphatic: Scattered calcification is seen along the abdominal aorta and its branches. The abdominal aorta is otherwise grossly unremarkable. The inferior vena cava is grossly unremarkable. No retroperitoneal lymphadenopathy is seen. No pelvic sidewall lymphadenopathy is identified. Reproductive: The bladder is decompressed, with a Foley catheter in place. Apparent bladder wall thickening may reflect cystitis. The prostate is normal in size. Other: No additional soft tissue abnormalities are seen. Musculoskeletal: No acute osseous abnormalities are identified. A left hip arthroplasty is grossly unremarkable in appearance, though incompletely imaged. Prominent cystic change is noted at the right ischium and ilium. Multilevel vacuum phenomenon is noted along the lower thoracic and lower lumbar spine. Underlying facet disease is noted. The visualized musculature is unremarkable in appearance. IMPRESSION: 1. No acute abnormality seen to explain the patient's symptoms. 2. Apparent bladder wall thickening may reflect cystitis. 3. Mild bibasilar atelectasis or scarring noted. 4. Small right renal cyst noted. 5.  Scattered aortic atherosclerosis. 6. Prominent cystic change at the right ischium and ilium, grossly unchanged from 2017. 7. Mild degenerative change along the lower thoracic and lower lumbar spine. Electronically Signed   By: Roanna Raider M.D.   On: 09/24/2016 00:51   Dg Chest Portable 1 View  Result Date: 09/23/2016 CLINICAL DATA:  SOB and entire abdominal pain. No chest pain. Also reports coughing up phlegm that's been going on for months. EXAM: PORTABLE CHEST 1 VIEW COMPARISON:  09/12/2016 FINDINGS: Mildly degraded exam due to AP portable technique and patient body habitus. Numerous leads and wires project over the chest. Midline trachea. Cardiomegaly accentuated by AP portable technique. Moderate right hemidiaphragm elevation. No pleural effusion or pneumothorax. Extremely low lung volumes, with resultant pulmonary  interstitial prominence. Patchy left mid and lower lung pulmonary opacities are similar to the 04/09/2016 exam and likely a present on 09/12/2016. IMPRESSION: Cardiomegaly with persistent or recurrent patchy left-sided airspace disease. Suspicious for pneumonia. Chronic scarring or even calcified pleural plaques could have a similar appearance. Cardiomegaly with right hemidiaphragm elevation and low lung volumes. No congestive failure. Electronically Signed   By: Jeronimo Greaves M.D.   On: 09/23/2016 20:16    Medications:   . B-complex with vitamin C  1 tablet Oral Daily  . Chlorhexidine Gluconate Cloth  6 each Topical Q0600  . cholecalciferol  5,000 Units Oral Daily  . cyanocobalamin  500 mcg Oral Daily  . dextromethorphan-guaiFENesin  1 tablet Oral BID  . dicyclomine  20 mg Oral Q6H  . enoxaparin (LOVENOX) injection  120 mg Subcutaneous Q12H  . furosemide  40 mg Oral BID  . gabapentin  600 mg Oral QHS  . ipratropium  0.5 mg Nebulization Q6H  . levalbuterol  1.25 mg Nebulization Q6H  . loratadine  10 mg Oral Daily  . metoprolol  50 mg Oral TID  . multivitamin with minerals  1  tablet Oral Daily  . mupirocin ointment  1 application Nasal BID  . pantoprazole  40 mg Oral Daily  . rOPINIRole  4 mg Oral QHS  . spironolactone  50 mg Oral BID   Continuous Infusions: . azithromycin    . cefTRIAXone (ROCEPHIN)  IV      Medical decision making is of high complexity and this patient is at high risk of deterioration, therefore this is a level 3 visit. (> 4 problem points, >4 data points, high risk)   LOS: 1 day   RAMA,CHRISTINA  [facility] Pager [phone]. If unable to reach me by pager, please call my cell phone at [phone].  *Please refer to [email or web address], password [email or web address] to get updated schedule on who will round on this patient, as hospitalists switch teams weekly. If 7PM-7AM, please contact night-coverage at [email or web address], password [email or web address] for any overnight needs.  09/24/2016, 7:29 AM

## 2016-09-24 NOTE — Progress Notes (Signed)
Patient arrived from ED about 23:35. MD in room to assess patient, order placed for Foley Cath. Foley inserted 34F by Christie NottinghamAnn Madiha Bambrick,RN and assisted by Ulyses AmorSharon, SWOT RN. Strict sterile techniques maintained through out procedure. Patient Tolerated well. Clear yellow urine draining after insertion.Will continue to monitor closely.

## 2016-09-24 NOTE — Progress Notes (Signed)
Pressure increased to increase VT.  RT will monitor.

## 2016-09-24 NOTE — Progress Notes (Signed)
Pharmacy: Coumadin  Coumadin per pharmacy consult received earlier today, however, no dose has been ordered yet. Home dose 7.5mg  daily with admit INR 1.08. Currently on lovenox bridge.  Plan: Coumadin 10mg  tonight Daily INR   Louie CasaJennifer Marvens Hollars, PharmD, BCPS 09/24/2016, 9:46 AM

## 2016-09-24 NOTE — Progress Notes (Signed)
Patient transported to CT for abdominal scan d/t 10/10 pain. Dr. Clyde LundborgNiu notified and he came to bedside immediately. Per Dr. Clyde LundborgNiu RN administered 2 mg of IV Morphine to help relieve the abdominal pain long enough to lie flat for CT scan. RT called to escort RN to CT since patient is on bipap and struggling to breath. F/C inserted per Dr. Clyde LundborgNiu d/t patients work of breathing and inability to lay flat and because the patient is incontinent. Urine specimen sent. Will obtain flu swab when patient returns from CT scan. Swot nurse and RT transported patient. He was A/O X 4 and in stable condition at time of transfer.

## 2016-09-24 NOTE — Progress Notes (Signed)
Patient slept the rest of the night once he returned from CT. No further complaints of pain or SOB. Remained on Bipap. Will continue to monitor and follow plan of care.

## 2016-09-24 NOTE — Consult Note (Addendum)
WOC consult requested for bilat legs.  Pt has been wearing Una boots prior to admission; he states it was to treat previous blisters. Removed Una boots and legs are free from any blisters to BLE, open wounds or edema.  No further indication for Una boots or other topical treatment this time. Discussed plan of care with patient and he verbalized understanding. Please re-consult if further assistance is needed.  Thank-you,  Cammie Mcgeeawn Caryn Gienger MSN, RN, CWOCN, VanceWCN-AP, CNS [phone]

## 2016-09-24 NOTE — Progress Notes (Signed)
Spoke with consulting MD about pauses in pts cardiac rhythm. Orders given to d/c metoprolol, and, if pauses continue to consult cardiology. Pt currently stable, will continue to monitor.

## 2016-09-24 NOTE — Progress Notes (Signed)
Pt transported to/from CT without e vent.  RT will monitor.

## 2016-09-24 NOTE — Progress Notes (Signed)
ANTICOAGULATION CONSULT NOTE - Initial Consult  Pharmacy Consult for Coumadin and Lovenox Indication: atrial fibrillation and h/o PE 03/2016  Allergies  Allergen Reactions  . Oxycodone Other (See Comments)    nightmares  . Benadryl [Diphenhydramine]     Patient Measurements: Height: 5\' 11"  (180.3 cm) Weight: 260 lb 6.4 oz (118.1 kg) IBW/kg (Calculated) : 75.3   Vital Signs: Temp: 97.6 F (36.4 C) (05/06 2330) Temp Source: Axillary (05/06 2330) BP: 115/67 (05/06 2330) Pulse Rate: 101 (05/07 0034)  Labs:  Recent Labs  09/23/16 1947 09/24/16 0026  HGB 14.6  --   HCT 43.7  --   PLT 233  --   LABPROT  --  14.1  INR  --  1.08  CREATININE 0.91  --   TROPONINI <0.03  --     Estimated Creatinine Clearance: 84.6 mL/min (by C-G formula based on SCr of 0.91 mg/dL).   Medical History: Past Medical History:  Diagnosis Date  . A-fib (HCC)   . Atrial fibrillation (HCC)   . Bronchitis   . CAD (coronary artery disease)   . CHF (congestive heart failure) (HCC)   . Constipation   . Edema   . Gait disturbance   . GERD (gastroesophageal reflux disease)   . Hypertension   . Obesity   . Pulmonary embolism (HCC)   . Restless leg syndrome   . Sleep hypopnea   . TIA (transient ischemic attack)   . Urinary bladder incontinence     Medications:  Prescriptions Prior to Admission  Medication Sig Dispense Refill Last Dose  . b complex vitamins capsule Take 1 capsule by mouth daily.   09/23/2016 at 0900  . cetirizine (ZYRTEC) 10 MG tablet Take 10 mg by mouth daily.   09/23/2016 at 0900  . Cholecalciferol (VITAMIN D3 ULTRA STRENGTH) 5000 units capsule Take 5,000 Units by mouth daily.   09/23/2016 at 0900  . dicyclomine (BENTYL) 20 MG tablet Take 20 mg by mouth every 6 (six) hours.   09/23/2016 at 1200  . docusate sodium (COLACE) 100 MG capsule Take 100 mg by mouth 2 (two) times daily as needed for mild constipation.    at prn  . furosemide (LASIX) 40 MG tablet Take 40 mg by mouth 2 (two)  times daily.   09/23/2016 at 0800  . gabapentin (NEURONTIN) 300 MG capsule Take 300mg  nightly for 1 week then increase to 2 tablets nightly. (Patient taking differently: Take 600 mg by mouth at bedtime. Take 300mg  nightly for 1 week then increase to 2 tablets nightly.) 21 capsule 3 09/22/2016 at 2000  . ipratropium-albuterol (DUONEB) 0.5-2.5 (3) MG/3ML SOLN Take 3 mLs by nebulization every 6 (six) hours as needed.   09/23/2016 at 1200  . metoprolol (LOPRESSOR) 50 MG tablet Take 50 mg by mouth 3 (three) times daily.   09/23/2016 at 0800  . Multiple Vitamins-Minerals (MULTIVITAMIN ADULT) TABS Take 1 tablet by mouth daily.   09/23/2016 at 0900  . omeprazole (PRILOSEC) 20 MG capsule Take 20 mg by mouth daily.   09/23/2016 at 0900  . OXYGEN Inhale 2 L into the lungs daily as needed (SOB).    at prn  . polyethylene glycol powder (GLYCOLAX/MIRALAX) powder Mix 17g in 8oz of water twice daily. May titrate as needed. 255 g 3 09/23/2016 at 0800  . potassium chloride SA (K-DUR,KLOR-CON) 20 MEQ tablet Take 20 mEq by mouth 2 (two) times daily.   09/23/2016 at 0900  . rOPINIRole (REQUIP) 4 MG tablet Take 4 mg by  mouth at bedtime.   09/23/2016 at 0900  . spironolactone (ALDACTONE) 50 MG tablet Take 50 mg by mouth 2 (two) times daily.   09/23/2016 at 0800  . traMADol (ULTRAM) 50 MG tablet Take by mouth every 6 (six) hours as needed for moderate pain.    at prn  . vitamin B-12 (CYANOCOBALAMIN) 500 MCG tablet Take 500 mcg by mouth daily.   09/23/2016 at 0900  . warfarin (COUMADIN) 7.5 MG tablet Take 7.5 mg by mouth daily.   09/22/2016 at 1700    Assessment: 83 y.o. M presents from NH with AMS, abd pain, SOB. Pt on coumadin PTA for afib and h/o PE (03/2016). Admission INR subtherapeutic (1.08). Spoke with MD and wants Lovenox bridging until INR >/=2. CBC ok on admission. Est CrCl 80 ml/min.  Home dose per NH records: Coumadin 7.5mg  daily - last taken 5/5  Goal of Therapy:  INR 2-3 Heparin level 0.3-0.7 units/ml Monitor platelets by  anticoagulation protocol: Yes   Plan: Lovenox 120 mg SQ q12h (until INR >/= 2) Daily INR CBC q72h while pt on Lovenox  Christoper Fabianaron Jagger Demonte, PharmD, BCPS Clinical pharmacist, pager [phone] 09/24/2016,1:34 AM

## 2016-09-25 DIAGNOSIS — Z7901 Long term (current) use of anticoagulants: Secondary | ICD-10-CM

## 2016-09-25 DIAGNOSIS — I4891 Unspecified atrial fibrillation: Secondary | ICD-10-CM

## 2016-09-25 DIAGNOSIS — I2510 Atherosclerotic heart disease of native coronary artery without angina pectoris: Secondary | ICD-10-CM

## 2016-09-25 LAB — CBC
HCT: 43.7 % (ref 39.0–52.0)
Hemoglobin: 13.9 g/dL (ref 13.0–17.0)
MCH: 30.6 pg (ref 26.0–34.0)
MCHC: 31.8 g/dL (ref 30.0–36.0)
MCV: 96.3 fL (ref 78.0–100.0)
PLATELETS: 179 10*3/uL (ref 150–400)
RBC: 4.54 MIL/uL (ref 4.22–5.81)
RDW: 13.9 % (ref 11.5–15.5)
WBC: 13.4 10*3/uL — AB (ref 4.0–10.5)

## 2016-09-25 LAB — BASIC METABOLIC PANEL
ANION GAP: 9 (ref 5–15)
BUN: 14 mg/dL (ref 6–20)
CO2: 27 mmol/L (ref 22–32)
Calcium: 8.7 mg/dL — ABNORMAL LOW (ref 8.9–10.3)
Chloride: 97 mmol/L — ABNORMAL LOW (ref 101–111)
Creatinine, Ser: 0.78 mg/dL (ref 0.61–1.24)
GFR calc Af Amer: >60 mL/min (ref >60)
Glucose, Bld: 95 mg/dL (ref 65–99)
POTASSIUM: 4.3 mmol/L (ref 3.5–5.1)
SODIUM: 133 mmol/L — AB (ref 135–145)

## 2016-09-25 LAB — PROTIME-INR
INR: 1.16
PROTHROMBIN TIME: 14.9 s (ref 11.4–15.2)

## 2016-09-25 LAB — LEGIONELLA PNEUMOPHILA SEROGP 1 UR AG: L. PNEUMOPHILA SEROGP 1 UR AG: NEGATIVE

## 2016-09-25 MED ORDER — WARFARIN SODIUM 10 MG PO TABS
10.0000 mg | ORAL_TABLET | Freq: Once | ORAL | Status: AC
  Administered 2016-09-25: 10 mg via ORAL
  Filled 2016-09-25: qty 1

## 2016-09-25 MED ORDER — ORAL CARE MOUTH RINSE
15.0000 mL | Freq: Two times a day (BID) | OROMUCOSAL | Status: DC
  Administered 2016-09-25 – 2016-09-28 (×6): 15 mL via OROMUCOSAL

## 2016-09-25 NOTE — Progress Notes (Signed)
[facility]                                                                              Patient Demographics  Joseph Barrera, is a 83 y.o. male, DOB - [birth year], ZOX:[id number]  Admit date - 09/23/2016   Admitting Physician Lorretta Harp, MD  Outpatient Primary MD for the patient is Patient, No Pcp Per  Outpatient specialists:   LOS - 2  days    Chief Complaint  Patient presents with  . Abdominal Pain       Brief summary   Hendry Speas is an 83 y.o. male with a PMH of chronic diastolic CHF, CAD status post CABG, atrial fibrillation on chronic Coumadin, history of TIA, RLS, GERD, depression, chronic respiratory failure on 2 L of supplemental oxygen at home, history pulmonary embolism who was admitted 09/23/16 with a chief complaint of worsening dyspnea over the course of one month, new onset of cough with clear/thick mucus, and a 2 year history of chronic intermittent abdominal pain that has gotten worse recently. He was found to be hypoxic with an oxygen saturation of 86% on admission and was placed on BiPAP. Chest x-ray showed patchy airspace disease on the left. Admitted to the SDU for further evaluation and treatment.   Assessment & Plan    Principal Problem:   CAP (community acquired pneumonia) complicated by acute on chronic respiratory failure with hypoxia - Patient required BiPAP overnight at the time of admission, currently off this morning, O2 sats 97% on 3 L - Continue IV Rocephin/azithromycin.  - Continue Mucinex and bronchodilators.  - Influenza panel negative, urine strep antigen negative, blood cultures negative so far, urine Legionella antigen pending.  - Lactic acid is not elevated.  - Chest x-ray showed cardiomegaly with left-sided airspace disease.   Active Problems:   Atrial fibrillation with RVR (HCC) - CHA2DS2-VASc Scoreis 6.  - Heart rate was controlled on beta blocker but however had two 6 second pauses on telemetry on 5/7 hence beta  blocker was held. Heart rate now elevated, on Lopressor 25 mg every 8 hours  (was on 50 mg 3 times a day prior to admission) - INR subtherapeutic. Coumadin per pharmacy.    History of Pulmonary embolism (HCC) - Continue Coumadin per pharmacy.    CAD (coronary artery disease) of artery bypass graft -Troponin 0.03. Continue metoprolol.     chronic Abdominal pain -Currently resolved, possibly from constipation. Lipase WNL. CT of the abdomen and pelvis negative for acute findings other than bladder wall thickening. Pain may be from this, constipation, or referred pain from mild degenerative disc disease or cystic changes in the right ischium/ileum. -  Morphine ineffective, continue Toradol as needed and low dose hydrocodone. Patient has had this pain for 2 years, and has become a bit irritable with the nurses that we have not figured out what is wrong. He also seems to think he needs surgery for this. Patient states that he had extensive workup done in [place] about his abdominal pain. I recommended patient to follow-up with his outpatient PCP to avoid duplicating the same workup. He does not have any abdominal  pain at this time and agrees with the plan.    GERD (gastroesophageal reflux disease) -Continue Protonix.    Depression -Continue Requip.    Chronic diastolic CHF (congestive heart failure) (HCC) - No pulmonary edema noted on chest radiography. Appears to be compensated.  - Continue home dose of Lasix, spironolactone and metoprolol.    Obesity, Class II, BMI 35.0-39.9, with comorbidity (see actual BMI) -Body mass index is 36.32 kg/m.     MRSA carrier - Continue Bactroban application    Chronic venous stasis ulcers Wound care nurse removed unna boots, blisters have healed and there are no other open wounds or edema, so unna boots have been discontinued.  Code Status: full  DVT Prophylaxis:  Warfarin  Family Communication: Discussed in detail with the patient, all  imaging results, lab results explained to the patient   Disposition Plan: Remain in step down today  Time Spent in minutes   25 minutes  Procedures:  None  Consultants:   None  Antimicrobials:   IV Zithromax 5/6 >  IV Rocephin 5/6  >   Medications  Scheduled Meds: . B-complex with vitamin C  1 tablet Oral Daily  . Chlorhexidine Gluconate Cloth  6 each Topical Q0600  . cholecalciferol  5,000 Units Oral Daily  . cyanocobalamin  500 mcg Oral Daily  . dextromethorphan-guaiFENesin  1 tablet Oral BID  . dicyclomine  20 mg Oral Q6H  . enoxaparin (LOVENOX) injection  120 mg Subcutaneous Q12H  . furosemide  40 mg Oral BID  . gabapentin  600 mg Oral QHS  . loratadine  10 mg Oral Daily  . metoprolol tartrate  25 mg Oral Q8H  . multivitamin with minerals  1 tablet Oral Daily  . mupirocin ointment  1 application Nasal BID  . pantoprazole  40 mg Oral Daily  . rOPINIRole  4 mg Oral QHS  . spironolactone  50 mg Oral BID  . warfarin  10 mg Oral ONCE-1800  . Warfarin - Pharmacist Dosing Inpatient   Does not apply q1800   Continuous Infusions: . azithromycin Stopped (09/24/16 2352)  . cefTRIAXone (ROCEPHIN)  IV Stopped (09/24/16 2153)   PRN Meds:.acetaminophen, docusate sodium, HYDROcodone-acetaminophen, hydrOXYzine, ipratropium, ketorolac, levalbuterol, morphine injection, polyethylene glycol powder, traMADol, zolpidem   Antibiotics   Anti-infectives    Start     Dose/Rate Route Frequency Ordered Stop   09/24/16 2200  cefTRIAXone (ROCEPHIN) 1 g in dextrose 5 % 50 mL IVPB     1 g 100 mL/hr over 30 Minutes Intravenous Every 24 hours 09/23/16 2231 10/01/16 2159   09/24/16 2200  azithromycin (ZITHROMAX) 500 mg in dextrose 5 % 250 mL IVPB     500 mg 250 mL/hr over 60 Minutes Intravenous Every 24 hours 09/23/16 2231 10/01/16 2159   09/23/16 2115  cefTRIAXone (ROCEPHIN) 1 g in dextrose 5 % 50 mL IVPB     1 g 100 mL/hr over 30 Minutes Intravenous  Once 09/23/16 2104 09/23/16 2159     09/23/16 2115  azithromycin (ZITHROMAX) 500 mg in dextrose 5 % 250 mL IVPB     500 mg 250 mL/hr over 60 Minutes Intravenous  Once 09/23/16 2104 09/23/16 2300        Subjective:   Nevyn Fries was seen and examined today.  Coughing during the encounter. No chest pain. No abdominal pain at this time, states when he woke up this morning he had no abdominal pain. Shortness of breath improving. Patient denies dizziness, chest pain, abdominal pain, N/V/D/C,  new weakness, numbess, tingling. No acute events overnight.    Objective:   Vitals:   09/25/16 0700 09/25/16 0757 09/25/16 0758 09/25/16 0825  BP: 102/71 96/81 96/81  117/78  Pulse: 86 74 70 87  Resp: (!) 22 (!) 23 19 (!) 24  Temp:   (!) 96 F (35.6 C)   TempSrc:   Axillary   SpO2: 99% 100% 99% 97%  Weight:      Height:        Intake/Output Summary (Last 24 hours) at 09/25/16 1204 Last data filed at 09/25/16 0930  Gross per 24 hour  Intake              720 ml  Output              851 ml  Net             -131 ml     Wt Readings from Last 3 Encounters:  09/23/16 118.1 kg (260 lb 6.4 oz)  09/12/16 111.6 kg (246 lb)  05/08/16 121.1 kg (267 lb)     Exam  General: Alert and oriented x 3, NAD  HEENT:    Neck: Supple, no JVD, no masses  Cardiovascular: S1 S2 auscultated,Irregularly irregular, no rubs, murmurs  Respiratory: Clear to auscultation bilaterally, no wheezing, rales or rhonchi  Gastrointestinal: Obese, Soft, nontender, nondistended, + bowel sounds  Ext: no cyanosis clubbing, 1+ edema  Neuro: AAOx3, Cr N's II- XII. Strength 5/5 upper and lower extremities bilaterally  Skin: No rashes  Psych: Normal affect and demeanor, alert and oriented x3    Data Reviewed:  I have personally reviewed following labs and imaging studies  Micro Results Recent Results (from the past 240 hour(s))  Blood culture (routine x 2)     Status: None (Preliminary result)   Collection Time: 09/23/16  9:21 PM  Result Value Ref  Range Status   Specimen Description BLOOD LEFT HAND  Final   Special Requests   Final    BOTTLES DRAWN AEROBIC AND ANAEROBIC Blood Culture adequate volume   Culture NO GROWTH < 24 HOURS  Final   Report Status PENDING  Incomplete  Blood culture (routine x 2)     Status: None (Preliminary result)   Collection Time: 09/23/16  9:21 PM  Result Value Ref Range Status   Specimen Description BLOOD RIGHT WRIST  Final   Special Requests   Final    BOTTLES DRAWN AEROBIC AND ANAEROBIC Blood Culture adequate volume   Culture PENDING  Incomplete   Report Status PENDING  Incomplete  MRSA PCR Screening     Status: Abnormal   Collection Time: 09/23/16 11:02 PM  Result Value Ref Range Status   MRSA by PCR POSITIVE (A) NEGATIVE Final    Comment:        The GeneXpert MRSA Assay (FDA approved for NASAL specimens only), is one component of a comprehensive MRSA colonization surveillance program. It is not intended to diagnose MRSA infection nor to guide or monitor treatment for MRSA infections. RESULT CALLED TO, READ BACK BY AND VERIFIED WITH: CORO,J RN [phone] 09/24/16 MITCHELL,L     Radiology Reports Dg Chest 2 View  Result Date: 09/12/2016 CLINICAL DATA:  Bilateral leg swelling.  History of CHF EXAM: CHEST  2 VIEW COMPARISON:  04/09/2016 FINDINGS: Cardiomegaly. Elevation of the right hemidiaphragm is stable. Mild vascular congestion. No overt edema. Bibasilar atelectasis noted. No visible effusions. IMPRESSION: Cardiomegaly with vascular congestion. Elevated right hemidiaphragm.  Bibasilar atelectasis. Electronically Signed  By: Charlett Nose M.D.   On: 09/12/2016 12:38   Ct Abdomen Pelvis W Contrast  Result Date: 09/24/2016 CLINICAL DATA:  Acute onset of generalized abdominal pain and shortness of breath. Initial encounter. EXAM: CT ABDOMEN AND PELVIS WITH CONTRAST TECHNIQUE: Multidetector CT imaging of the abdomen and pelvis was performed using the standard protocol following bolus administration of  intravenous contrast. CONTRAST:  ISOVUE-300 IOPAMIDOL (ISOVUE-300) INJECTION 61% COMPARISON:  CT of the abdomen and pelvis from 04/09/2016 FINDINGS: Lower chest: Mild bibasilar atelectasis or scarring is noted. The visualized portions of the mediastinum are grossly unremarkable. Hepatobiliary: Scattered calcified granulomata are noted within the liver. The liver is otherwise unremarkable. The patient is status post cholecystectomy, with clips noted at the gallbladder fossa. The common bile duct remains normal in caliber. Pancreas: The pancreas is within normal limits. Spleen: Scattered calcified granulomata are seen within the spleen. Adrenals/Urinary Tract: The adrenal glands are grossly unremarkable in appearance. Nonspecific perinephric stranding is noted bilaterally. A small right renal cyst is noted. There is no evidence of hydronephrosis. No renal or ureteral stones are identified. Stomach/Bowel: The stomach is unremarkable in appearance. The small bowel is within normal limits. The patient is status post appendectomy. The colon is unremarkable in appearance. Vascular/Lymphatic: Scattered calcification is seen along the abdominal aorta and its branches. The abdominal aorta is otherwise grossly unremarkable. The inferior vena cava is grossly unremarkable. No retroperitoneal lymphadenopathy is seen. No pelvic sidewall lymphadenopathy is identified. Reproductive: The bladder is decompressed, with a Foley catheter in place. Apparent bladder wall thickening may reflect cystitis. The prostate is normal in size. Other: No additional soft tissue abnormalities are seen. Musculoskeletal: No acute osseous abnormalities are identified. A left hip arthroplasty is grossly unremarkable in appearance, though incompletely imaged. Prominent cystic change is noted at the right ischium and ilium. Multilevel vacuum phenomenon is noted along the lower thoracic and lower lumbar spine. Underlying facet disease is noted. The  visualized musculature is unremarkable in appearance. IMPRESSION: 1. No acute abnormality seen to explain the patient's symptoms. 2. Apparent bladder wall thickening may reflect cystitis. 3. Mild bibasilar atelectasis or scarring noted. 4. Small right renal cyst noted. 5. Scattered aortic atherosclerosis. 6. Prominent cystic change at the right ischium and ilium, grossly unchanged from 2017. 7. Mild degenerative change along the lower thoracic and lower lumbar spine. Electronically Signed   By: Roanna Raider M.D.   On: 09/24/2016 00:51   Dg Chest Portable 1 View  Result Date: 09/23/2016 CLINICAL DATA:  SOB and entire abdominal pain. No chest pain. Also reports coughing up phlegm that's been going on for months. EXAM: PORTABLE CHEST 1 VIEW COMPARISON:  09/12/2016 FINDINGS: Mildly degraded exam due to AP portable technique and patient body habitus. Numerous leads and wires project over the chest. Midline trachea. Cardiomegaly accentuated by AP portable technique. Moderate right hemidiaphragm elevation. No pleural effusion or pneumothorax. Extremely low lung volumes, with resultant pulmonary interstitial prominence. Patchy left mid and lower lung pulmonary opacities are similar to the 04/09/2016 exam and likely a present on 09/12/2016. IMPRESSION: Cardiomegaly with persistent or recurrent patchy left-sided airspace disease. Suspicious for pneumonia. Chronic scarring or even calcified pleural plaques could have a similar appearance. Cardiomegaly with right hemidiaphragm elevation and low lung volumes. No congestive failure. Electronically Signed   By: Jeronimo Greaves M.D.   On: 09/23/2016 20:16    Lab Data:  CBC:  Recent Labs Lab 09/23/16 1947 09/25/16 0321  WBC 13.7* 13.4*  NEUTROABS 10.3*  --  HGB 14.6 13.9  HCT 43.7 43.7  MCV 95.2 96.3  PLT 233 179   Basic Metabolic Panel:  Recent Labs Lab 09/23/16 1947 09/25/16 0321  NA 133* 133*  K 4.2 4.3  CL 98* 97*  CO2 28 27  GLUCOSE 104* 95  BUN  13 14  CREATININE 0.91 0.78  CALCIUM 8.9 8.7*   GFR: Estimated Creatinine Clearance: 96.3 mL/min (by C-G formula based on SCr of 0.78 mg/dL). Liver Function Tests:  Recent Labs Lab 09/23/16 1947  AST 24  ALT 11*  ALKPHOS 65  BILITOT 0.5  PROT 7.3  ALBUMIN 3.0*    Recent Labs Lab 09/23/16 1947  LIPASE 31   No results for input(s): AMMONIA in the last 168 hours. Coagulation Profile:  Recent Labs Lab 09/24/16 0026 09/25/16 0321  INR 1.08 1.16   Cardiac Enzymes:  Recent Labs Lab 09/23/16 1947  TROPONINI <0.03   BNP (last 3 results) No results for input(s): PROBNP in the last 8760 hours. HbA1C: No results for input(s): HGBA1C in the last 72 hours. CBG:  Recent Labs Lab 09/24/16 1625  GLUCAP 109*   Lipid Profile: No results for input(s): CHOL, HDL, LDLCALC, TRIG, CHOLHDL, LDLDIRECT in the last 72 hours. Thyroid Function Tests: No results for input(s): TSH, T4TOTAL, FREET4, T3FREE, THYROIDAB in the last 72 hours. Anemia Panel: No results for input(s): VITAMINB12, FOLATE, FERRITIN, TIBC, IRON, RETICCTPCT in the last 72 hours. Urine analysis:    Component Value Date/Time   COLORURINE YELLOW 09/23/2016 2112   APPEARANCEUR CLEAR 09/23/2016 2112   LABSPEC 1.010 09/23/2016 2112   PHURINE 7.0 09/23/2016 2112   GLUCOSEU NEGATIVE 09/23/2016 2112   HGBUR NEGATIVE 09/23/2016 2112   BILIRUBINUR NEGATIVE 09/23/2016 2112   KETONESUR NEGATIVE 09/23/2016 2112   PROTEINUR NEGATIVE 09/23/2016 2112   NITRITE NEGATIVE 09/23/2016 2112   LEUKOCYTESUR NEGATIVE 09/23/2016 2112     Kamarie Palma M.D. [facility] 09/25/2016, 12:04 PM  Pager: [phone] Between 7am to 7pm - call Pager - [phone]  After 7pm go to [email or web address] - password [email or web address]  Call night coverage person covering after 7pm

## 2016-09-25 NOTE — Progress Notes (Signed)
ANTICOAGULATION CONSULT NOTE - Follow Up Consult  Pharmacy Consult for Coumadin and Lovenox Indication: atrial fibrillation and h/o PE 03/2016  Allergies  Allergen Reactions  . Oxycodone Other (See Comments)    nightmares  . Benadryl [Diphenhydramine]     Patient Measurements: Height: 5\' 11"  (180.3 cm) Weight: 260 lb 6.4 oz (118.1 kg) IBW/kg (Calculated) : 75.3  Vital Signs: Temp: 97.5 F (36.4 C) (05/07 2047) Temp Source: Oral (05/08 0400) BP: 102/71 (05/08 0700) Pulse Rate: 86 (05/08 0700)  Labs:  Recent Labs  09/23/16 1947 09/24/16 0026 09/25/16 0321  HGB 14.6  --  13.9  HCT 43.7  --  43.7  PLT 233  --  179  LABPROT  --  14.1 14.9  INR  --  1.08 1.16  CREATININE 0.91  --  0.78  TROPONINI <0.03  --   --     Estimated Creatinine Clearance: 96.3 mL/min (by C-G formula based on SCr of 0.78 mg/dL).  Assessment: 82yom on coumadin pta for afib and hx PE. Admit INR 1.08 and lovenox bridge added. INR 1.16 today. Renal function and CBC stable. No bleeding. *Coumadin dose missed 5/6 as he was in the ED and consult not ordered until early 5/7 AM*  Home dose: 7.5mg  daily - last taken 5/5  Goal of Therapy:  Anti-Xa level 0.6-1.2 (4h after LMWH given) INR 2-3 Monitor platelets by anticoagulation protocol: Yes   Plan:  1) Coumadin 10mg  again tonight 2) Continue lovenox 120mg  sq q12 3) Daily INR, CBC q72 at least  Fredrik RiggerMarkle, Orena Cavazos Sue 09/25/2016,7:33 AM

## 2016-09-25 NOTE — Consult Note (Addendum)
Reason for Consult:   AF with SSS  Requesting Physician: Joseph Isidoro Donning Primary Cardiologist Joseph Barrera  HPI:   Joseph Barrera is a 83 y.o. male who is being seen today for the evaluation of atrial fibrillation with sinus pauses at the request of Joseph Isidoro Donning  83 y/o male, recently moved to [place] from [place] to be closer to family. He was seen by Joseph Barrera and Joseph Barrera in Dec 2017. He resides at an assisted living facility, they dispense his medication. He has a history of atrial fibrillation, TIA 2, PE/DVT-(on Coumadin), obstructive sleep apnea on C-pap, and a reported history of CAD/MI, however the patient does not recall ever having a heart catheterization. He did have a Myoview in May of 2017 that was low risk with preserved LVF.   It appears his AF is permanent. He tells me he was "threatened with a pacemaker" in the past.  He did have a syncopal spell in Dec 2017 while at physical therapy, the etiology was not determined. He has chronic lower extremity edema and wears compression stockings. He is also on Lasix as well as a beta blocker. His INRs are followed at his assisted living facility.  From a symptom standpoint, he is asymptomatic. He has bilateral lower extremity edema and was seen in the ED in April for this. He was given one dose of Lasix and disicharged. His BNP has been less than 100. He is obese and has limited mobility. He uses C-pap at night and O2 PRN during the day. He has no anginal symptomatology.   He was admitted from his assisted living facility 09/23/16 with abdominal pain. His O2 sats were low and a CXR suggested pneumonia. Since admission he has been in AF. 09/24/16 he had at least one pause of 5 seconds. His beta blocker was held but today his HR was elevated to 120 and it was resumed and cardiology asked to see.    PMHx:  Past Medical History:  Diagnosis Date  . A-fib (HCC)   . Atrial fibrillation (HCC)   . Bronchitis   . CAD  (coronary artery disease)   . CHF (congestive heart failure) (HCC)   . Constipation   . Edema   . Gait disturbance   . GERD (gastroesophageal reflux disease)   . Hypertension   . Obesity   . Pulmonary embolism (HCC)   . Restless leg syndrome   . Sleep hypopnea   . TIA (transient ischemic attack)   . Urinary bladder incontinence     Past Surgical History:  Procedure Laterality Date  . ADENOIDECTOMY    . APPENDECTOMY    . CHOLECYSTECTOMY    . HIP SURGERY Left   . KNEE SURGERY Bilateral   . REPLACEMENT TOTAL KNEE BILATERAL    . TONSILLECTOMY    . TOTAL HIP ARTHROPLASTY Left     SOCHx:  reports that he quit smoking about 55 years ago. His smoking use included Cigarettes. He has never used smokeless tobacco. He reports that he does not drink alcohol or use drugs.  FAMHx: Family History  Problem Relation Age of Onset  . CVA Father   . Lead poisoning Brother     ALLERGIES: Allergies  Allergen Reactions  . Oxycodone Other (See Comments)    nightmares  . Benadryl [Diphenhydramine]     ROS: Review of Systems: General: negative for chills, fever, night sweats or weight changes.  Cardiovascular: negative for chest pain, orthopnea,  palpitations, paroxysmal nocturnal dyspnea HEENT: negative for any visual disturbances, blindness, glaucoma Dermatological: negative for rash Respiratory: negative for cough, hemoptysis, or wheezing Urologic: negative for hematuria or dysuria Abdominal: negative for nausea, vomiting,  bright red blood per rectum, melena, or hematemesis + diarrhea prior to admission Neurologic: negative for visual changes, syncope, or dizziness Musculoskeletal: negative for back pain, joint pain, or swelling Psych: cooperative and appropriate + restless leg All other systems reviewed and are otherwise negative except as noted above.   HOME MEDICATIONS: Prior to Admission medications   Medication Sig Start Date End Date Taking? Authorizing Provider  b  complex vitamins capsule Take 1 capsule by mouth daily.   Yes [provider]  cetirizine (ZYRTEC) 10 MG tablet Take 10 mg by mouth daily.   Yes [provider]  Cholecalciferol (VITAMIN D3 ULTRA STRENGTH) 5000 units capsule Take 5,000 Units by mouth daily.   Yes [provider]  dicyclomine (BENTYL) 20 MG tablet Take 20 mg by mouth every 6 (six) hours.   Yes [provider]  docusate sodium (COLACE) 100 MG capsule Take 100 mg by mouth 2 (two) times daily as needed for mild constipation.   Yes [provider]  furosemide (LASIX) 40 MG tablet Take 40 mg by mouth 2 (two) times daily.   Yes [provider]  gabapentin (NEURONTIN) 300 MG capsule Take 300mg  nightly for 1 week then increase to 2 tablets nightly. Patient taking differently: Take 600 mg by mouth at bedtime. Take 300mg  nightly for 1 week then increase to 2 tablets nightly. 05/03/16  Yes Armbruster, Reeves Forth, MD  ipratropium-albuterol (DUONEB) 0.5-2.5 (3) MG/3ML SOLN Take 3 mLs by nebulization every 6 (six) hours as needed.   Yes [provider]  metoprolol (LOPRESSOR) 50 MG tablet Take 50 mg by mouth 3 (three) times daily.   Yes [provider]  Multiple Vitamins-Minerals (MULTIVITAMIN ADULT) TABS Take 1 tablet by mouth daily.   Yes [provider]  omeprazole (PRILOSEC) 20 MG capsule Take 20 mg by mouth daily.   Yes [provider]  OXYGEN Inhale 2 L into the lungs daily as needed (SOB).   Yes [provider]  polyethylene glycol powder (GLYCOLAX/MIRALAX) powder Mix 17g in 8oz of water twice daily. May titrate as needed. 09/20/16  Yes Armbruster, Reeves Forth, MD  potassium chloride SA (K-DUR,KLOR-CON) 20 MEQ tablet Take 20 mEq by mouth 2 (two) times daily.   Yes [provider]  rOPINIRole (REQUIP) 4 MG tablet Take 4 mg by mouth at bedtime.   Yes [provider]  spironolactone (ALDACTONE) 50 MG tablet Take 50 mg by mouth 2  (two) times daily.   Yes [provider]  traMADol (ULTRAM) 50 MG tablet Take by mouth every 6 (six) hours as needed for moderate pain.   Yes [provider]  vitamin B-12 (CYANOCOBALAMIN) 500 MCG tablet Take 500 mcg by mouth daily.   Yes [provider]  warfarin (COUMADIN) 7.5 MG tablet Take 7.5 mg by mouth daily.   Yes [provider]    HOSPITAL MEDICATIONS: I have reviewed the patient's current medications.  VITALS: Blood pressure 114/80, pulse (!) 34, temperature 97.6 F (36.4 C), temperature source Oral, resp. rate (!) 23, height 5\' 11"  (1.803 m), weight 260 lb 6.4 oz (118.1 kg), SpO2 90 %.  PHYSICAL EXAM: General appearance: alert, cooperative, no distress and morbidly obese Neck: no carotid bruit and no JVD Lungs: decreased BS on lt Heart: irregularly irregular rhythm  Abdomen: obese, non tender Extremities: venous stasis dermatitis noted and trace edema Pulses: diminnished Skin: Skin color, texture, turgor normal. No rashes or lesions Neurologic: Grossly normal  LABS: Results for orders placed or performed during the hospital encounter of 09/23/16 (from the past 24 hour(s))  Glucose, capillary     Status: Abnormal   Collection Time: 09/24/16  4:25 PM  Result Value Ref Range   Glucose-Capillary 109 (H) 65 - 99 mg/dL  Protime-INR     Status: None   Collection Time: 09/25/16  3:21 AM  Result Value Ref Range   Prothrombin Time 14.9 11.4 - 15.2 seconds   INR 1.16   CBC     Status: Abnormal   Collection Time: 09/25/16  3:21 AM  Result Value Ref Range   WBC 13.4 (H) 4.0 - 10.5 K/uL   RBC 4.54 4.22 - 5.81 MIL/uL   Hemoglobin 13.9 13.0 - 17.0 g/dL   HCT 40.9 81.1 - 91.4 %   MCV 96.3 78.0 - 100.0 fL   MCH 30.6 26.0 - 34.0 pg   MCHC 31.8 30.0 - 36.0 g/dL   RDW 78.2 95.6 - 21.3 %   Platelets 179 150 - 400 K/uL  Basic metabolic panel     Status: Abnormal   Collection Time: 09/25/16  3:21 AM  Result Value Ref Range   Sodium 133 (L) 135  - 145 mmol/L   Potassium 4.3 3.5 - 5.1 mmol/L   Chloride 97 (L) 101 - 111 mmol/L   CO2 27 22 - 32 mmol/L   Glucose, Bld 95 65 - 99 mg/dL   BUN 14 6 - 20 mg/dL   Creatinine, Ser 0.86 0.61 - 1.24 mg/dL   Calcium 8.7 (L) 8.9 - 10.3 mg/dL   GFR calc non Af Amer >60 >60 mL/min   GFR calc Af Amer >60 >60 mL/min   Anion gap 9 5 - 15    EKG: AF with RVR, LAD, QTc 503  IMAGING: Ct Abdomen Pelvis W Contrast  Result Date: 09/24/2016 CLINICAL DATA:  Acute onset of generalized abdominal pain and shortness of breath. Initial encounter. EXAM: CT ABDOMEN AND PELVIS WITH CONTRAST TECHNIQUE: Multidetector CT imaging of the abdomen and pelvis was performed using the standard protocol following bolus administration of intravenous contrast. CONTRAST:  ISOVUE-300 IOPAMIDOL (ISOVUE-300) INJECTION 61% COMPARISON:  CT of the abdomen and pelvis from 04/09/2016 FINDINGS: Lower chest: Mild bibasilar atelectasis or scarring is noted. The visualized portions of the mediastinum are grossly unremarkable. Hepatobiliary: Scattered calcified granulomata are noted within the liver. The liver is otherwise unremarkable. The patient is status post cholecystectomy, with clips noted at the gallbladder fossa. The common bile duct remains normal in caliber. Pancreas: The pancreas is within normal limits. Spleen: Scattered calcified granulomata are seen within the spleen. Adrenals/Urinary Tract: The adrenal glands are grossly unremarkable in appearance. Nonspecific perinephric stranding is noted bilaterally. A small right renal cyst is noted. There is no evidence of hydronephrosis. No renal or ureteral stones are identified. Stomach/Bowel: The stomach is unremarkable in appearance. The small bowel is within normal limits. The patient is status post appendectomy. The colon is unremarkable in appearance. Vascular/Lymphatic: Scattered calcification is seen along the abdominal aorta and its branches. The abdominal aorta is otherwise grossly  unremarkable. The inferior vena cava is grossly unremarkable. No retroperitoneal lymphadenopathy is seen. No pelvic sidewall lymphadenopathy is identified. Reproductive: The bladder is decompressed, with a Foley catheter in place. Apparent bladder wall thickening may reflect cystitis. The prostate is normal  in size. Other: No additional soft tissue abnormalities are seen. Musculoskeletal: No acute osseous abnormalities are identified. A left hip arthroplasty is grossly unremarkable in appearance, though incompletely imaged. Prominent cystic change is noted at the right ischium and ilium. Multilevel vacuum phenomenon is noted along the lower thoracic and lower lumbar spine. Underlying facet disease is noted. The visualized musculature is unremarkable in appearance. IMPRESSION: 1. No acute abnormality seen to explain the patient's symptoms. 2. Apparent bladder wall thickening may reflect cystitis. 3. Mild bibasilar atelectasis or scarring noted. 4. Small right renal cyst noted. 5. Scattered aortic atherosclerosis. 6. Prominent cystic change at the right ischium and ilium, grossly unchanged from 2017. 7. Mild degenerative change along the lower thoracic and lower lumbar spine. Electronically Signed   By: Roanna Raider M.D.   On: 09/24/2016 00:51   Dg Chest Portable 1 View  Result Date: 09/23/2016 CLINICAL DATA:  SOB and entire abdominal pain. No chest pain. Also reports coughing up phlegm that's been going on for months. EXAM: PORTABLE CHEST 1 VIEW COMPARISON:  09/12/2016 FINDINGS: Mildly degraded exam due to AP portable technique and patient body habitus. Numerous leads and wires project over the chest. Midline trachea. Cardiomegaly accentuated by AP portable technique. Moderate right hemidiaphragm elevation. No pleural effusion or pneumothorax. Extremely low lung volumes, with resultant pulmonary interstitial prominence. Patchy left mid and lower lung pulmonary opacities are similar to the 04/09/2016 exam and  likely a present on 09/12/2016. IMPRESSION: Cardiomegaly with persistent or recurrent patchy left-sided airspace disease. Suspicious for pneumonia. Chronic scarring or even calcified pleural plaques could have a similar appearance. Cardiomegaly with right hemidiaphragm elevation and low lung volumes. No congestive failure. Electronically Signed   By: Jeronimo Greaves M.D.   On: 09/23/2016 20:16    IMPRESSION:  AF with SSS Pt appears to have permanent AF with SSS. He had a significant pause yesterday of 5 seconds (asymptomatic). He has had syncope in the past.  CAP CAP by CVXR- per primary service  CAD History is not clear. Myoview from an outside facility May 2017 was low risk and his LVF was normal  Chronic anticoagulation On Coumadin for PAF and history of PE (2009)-  INR 1.16 on admission ??   Obesity/debilitation  He currently resides in an assisted living facility  Sleep apnea On c-pap   RECOMMENDATION: Joseph Barrera to see- ? Consider pacemaker. He does not appear to be in CHF. Check echo. Document TSH.  Long term consider change to NOAC.   Time Spent Directly with Patient: 50 minutes  Corine Shelter, [place]  [phone] beeper 09/25/2016, 2:27 PM   I have seen and examined this patient with Corine Shelter.  Agree with above, note added to reflect my findings.  On exam, iRRR, no murmurs, lungs clear.  Patient presented to the ER with SOB for one month. Thought to have CAP with chronic respiratory failure and hypoxia. Has AF and was put on home meds with multiple pauses of up to 5 seconds. Beta blockers were held and he has had no further pauses. Was restarted on 25 mg metoprolol for AF with RVR. HR on telemetry since restarting metoprolol has been around 100 bpm. Per the RACE II trial, HR of 90-110 resting are acceptable in AF. The patient's HR seems to be much better controlled. Would continue metoprolol at current dosage and monitor his HR when ambulating. He may need a pacemaker in the  future, but would prefer to place it as an outpatient if he tolerates  the metoprolol without further pauses. With his possibility of pneumonia, would like to avoid pacemaker implant with active infection. Joseph Barrera arrange follow up in EP clinic for further discussions. TTE pending.   Joseph Brobeck M. Hung Rhinesmith MD 09/25/2016 3:53 PM

## 2016-09-26 DIAGNOSIS — R109 Unspecified abdominal pain: Secondary | ICD-10-CM

## 2016-09-26 LAB — BASIC METABOLIC PANEL
Anion gap: 10 (ref 5–15)
BUN: 14 mg/dL (ref 6–20)
CO2: 28 mmol/L (ref 22–32)
Calcium: 8.6 mg/dL — ABNORMAL LOW (ref 8.9–10.3)
Chloride: 95 mmol/L — ABNORMAL LOW (ref 101–111)
Creatinine, Ser: 0.8 mg/dL (ref 0.61–1.24)
GFR calc Af Amer: >60 mL/min (ref >60)
GFR calc non Af Amer: >60 mL/min (ref >60)
Glucose, Bld: 93 mg/dL (ref 65–99)
Potassium: 4 mmol/L (ref 3.5–5.1)
Sodium: 133 mmol/L — ABNORMAL LOW (ref 135–145)

## 2016-09-26 LAB — PROTIME-INR
INR: 1.43
Prothrombin Time: 17.6 seconds — ABNORMAL HIGH (ref 11.4–15.2)

## 2016-09-26 LAB — CBC
HEMATOCRIT: 41.8 % (ref 39.0–52.0)
Hemoglobin: 13.6 g/dL (ref 13.0–17.0)
MCH: 31.1 pg (ref 26.0–34.0)
MCHC: 32.5 g/dL (ref 30.0–36.0)
MCV: 95.7 fL (ref 78.0–100.0)
PLATELETS: 193 10*3/uL (ref 150–400)
RBC: 4.37 MIL/uL (ref 4.22–5.81)
RDW: 13.8 % (ref 11.5–15.5)
WBC: 9.7 10*3/uL (ref 4.0–10.5)

## 2016-09-26 LAB — TSH: TSH: 1.249 u[IU]/mL (ref 0.350–4.500)

## 2016-09-26 LAB — GLUCOSE, CAPILLARY: Glucose-Capillary: 144 mg/dL — ABNORMAL HIGH (ref 65–99)

## 2016-09-26 MED ORDER — WARFARIN SODIUM 7.5 MG PO TABS
7.5000 mg | ORAL_TABLET | Freq: Once | ORAL | Status: AC
  Administered 2016-09-26: 7.5 mg via ORAL
  Filled 2016-09-26: qty 1

## 2016-09-26 NOTE — Progress Notes (Signed)
ANTICOAGULATION CONSULT NOTE - Follow Up Consult  Pharmacy Consult for Coumadin and Lovenox Indication: atrial fibrillation and h/o PE 03/2016  Allergies  Allergen Reactions  . Oxycodone Other (See Comments)    nightmares  . Benadryl [Diphenhydramine]     Patient Measurements: Height: 5\' 11"  (180.3 cm) Weight: 260 lb 6.4 oz (118.1 kg) IBW/kg (Calculated) : 75.3  Vital Signs: Temp: 97.5 F (36.4 C) (05/09 0400) Temp Source: Axillary (05/09 0400) BP: 110/76 (05/09 0400) Pulse Rate: 102 (05/09 0400)  Labs:  Recent Labs  09/23/16 1947 09/24/16 0026 09/25/16 0321 09/26/16 0629  HGB 14.6  --  13.9 13.6  HCT 43.7  --  43.7 41.8  PLT 233  --  179 193  LABPROT  --  14.1 14.9 17.6*  INR  --  1.08 1.16 1.43  CREATININE 0.91  --  0.78 0.80  TROPONINI <0.03  --   --   --     Estimated Creatinine Clearance: 96.3 mL/min (by C-G formula based on SCr of 0.8 mg/dL).  Assessment: 82yom on coumadin pta for afib and hx PE. Admit INR 1.08 and lovenox bridge added. INR trending up to 1.43 with 10mg  doses.  Renal function and CBC stable. No bleeding. *Coumadin dose missed 5/6 as he was in the ED and consult not ordered until early 5/7 AM*  Home dose: 7.5mg  daily - last taken 5/5  Goal of Therapy:  Anti-Xa level 0.6-1.2 (4h after LMWH given) INR 2-3 Monitor platelets by anticoagulation protocol: Yes   Plan:  1) Coumadin 7.5mg  tonight 2) Continue lovenox 120mg  sq q12 3) Daily INR, CBC q72 at least  Joseph RiggerMarkle, Joseph Barrera 09/26/2016,7:43 AM

## 2016-09-26 NOTE — Progress Notes (Signed)
[facility]                                                                              Patient Demographics  Joseph Barrera, is a 83 y.o. male, DOB - [birth year], ZOX:[id number]  Admit date - 09/23/2016   Admitting Physician Lorretta HarpXilin Niu, MD  Outpatient Primary MD for the patient is Patient, No Pcp Per  Outpatient specialists:   LOS - 3  days    Chief Complaint  Patient presents with  . Abdominal Pain       Brief summary   Joseph Barrera is an 83 y.o. male with a PMH of chronic diastolic CHF, CAD status post CABG, atrial fibrillation on chronic Coumadin, history of TIA, RLS, GERD, depression, chronic respiratory failure on 2 L of supplemental oxygen at home, history pulmonary embolism who was admitted 09/23/16 with a chief complaint of worsening dyspnea over the course of one month, new onset of cough with clear/thick mucus, and a 2 year history of chronic intermittent abdominal pain that has gotten worse recently. He was found to be hypoxic with an oxygen saturation of 86% on admission and was placed on BiPAP. Chest x-ray showed patchy airspace disease on the left. Admitted to the SDU for further evaluation and treatment.   Assessment & Plan    Principal Problem:   CAP (community acquired pneumonia) complicated by acute on chronic respiratory failure with hypoxia - Patient required BiPAP overnight at the time of admission,BiPAP being off however overnight patient is placed back on BiPAP. -  DC BiPAP and placed on CPAP qhs - Continue IV Rocephin/azithromycin.  - Continue Mucinex and bronchodilators.  - Influenza panel negative, urine strep antigen negative, blood cultures negative so far, urine Legionella antigen negative  - Lactic acid is not elevated.  - Chest x-ray showed cardiomegaly with left-sided airspace disease.   Active Problems:   Atrial fibrillation with RVR (HCC) - CHA2DS2-VASc Scoreis 6.  - Heart rate was controlled on beta blocker but however  had two 6 second pauses on telemetry on 5/7 hence beta blocker was held. Heart rate now elevated, on Lopressor 25 mg every 8 hours  (was on 50 mg 3 times a day prior to admission) - INR subtherapeutic. Coumadin per pharmacy.    History of Pulmonary embolism (HCC) - Continue Coumadin per pharmacy.    CAD (coronary artery disease) of artery bypass graft -Troponin 0.03. Continue metoprolol.     chronic Abdominal pain -Currently resolved, possibly from constipation. Lipase WNL. CT of the abdomen and pelvis negative for acute findings other than bladder wall thickening. Pain may be from this, constipation, or referred pain from mild degenerative disc disease or cystic changes in the right ischium/ileum. -  Morphine ineffective, continue Toradol as needed and low dose hydrocodone. Patient has had this pain for 2 years, and has become a bit irritable with the nurses that we have not figured out what is wrong. He also seems to think he needs surgery for this. Patient states that he [place] abdominal pain. I recommended patient to follow-up with his outpatient PCP to avoid duplicating  the same workup. He does not have any abdominal pain at this time and agrees with the plan.    GERD (gastroesophageal reflux disease) -Continue Protonix.    Depression -Continue Requip.    Chronic diastolic CHF (congestive heart failure) (HCC) - No pulmonary edema noted on chest radiography. Appears to be compensated.  - Continue home dose of Lasix, spironolactone and metoprolol.    Obesity, Class II, BMI 35.0-39.9, with comorbidity (see actual BMI) -Body mass index is 36.32 kg/m.     MRSA carrier - Continue Bactroban application    Chronic venous stasis ulcers Wound care nurse removed unna boots, blisters have healed and there are no other open wounds or edema, so unna boots have been discontinued.  Code Status: full  DVT Prophylaxis:  Warfarin  Family  Communication: Discussed in detail with the patient, all imaging results, lab results explained to the patient   Disposition Plan: Transfer to telemetry floor, start PT  Time Spent in minutes   25 minutes  Procedures:  None  Consultants:   None  Antimicrobials:   IV Zithromax 5/6 >  IV Rocephin 5/6  >   Medications  Scheduled Meds: . B-complex with vitamin C  1 tablet Oral Daily  . Chlorhexidine Gluconate Cloth  6 each Topical Q0600  . cholecalciferol  5,000 Units Oral Daily  . cyanocobalamin  500 mcg Oral Daily  . dextromethorphan-guaiFENesin  1 tablet Oral BID  . dicyclomine  20 mg Oral Q6H  . enoxaparin (LOVENOX) injection  120 mg Subcutaneous Q12H  . furosemide  40 mg Oral BID  . gabapentin  600 mg Oral QHS  . loratadine  10 mg Oral Daily  . mouth rinse  15 mL Mouth Rinse BID  . metoprolol tartrate  25 mg Oral Q8H  . multivitamin with minerals  1 tablet Oral Daily  . mupirocin ointment  1 application Nasal BID  . pantoprazole  40 mg Oral Daily  . rOPINIRole  4 mg Oral QHS  . spironolactone  50 mg Oral BID  . warfarin  7.5 mg Oral ONCE-1800  . Warfarin - Pharmacist Dosing Inpatient   Does not apply q1800   Continuous Infusions: . azithromycin Stopped (09/26/16 0031)  . cefTRIAXone (ROCEPHIN)  IV Stopped (09/25/16 2331)   PRN Meds:.acetaminophen, docusate sodium, HYDROcodone-acetaminophen, hydrOXYzine, ipratropium, ketorolac, levalbuterol, morphine injection, polyethylene glycol powder, traMADol, zolpidem   Antibiotics   Anti-infectives    Start     Dose/Rate Route Frequency Ordered Stop   09/24/16 2200  cefTRIAXone (ROCEPHIN) 1 g in dextrose 5 % 50 mL IVPB     1 g 100 mL/hr over 30 Minutes Intravenous Every 24 hours 09/23/16 2231 10/01/16 2159   09/24/16 2200  azithromycin (ZITHROMAX) 500 mg in dextrose 5 % 250 mL IVPB     500 mg 250 mL/hr over 60 Minutes Intravenous Every 24 hours 09/23/16 2231 10/01/16 2159   09/23/16 2115  cefTRIAXone (ROCEPHIN) 1 g  in dextrose 5 % 50 mL IVPB     1 g 100 mL/hr over 30 Minutes Intravenous  Once 09/23/16 2104 09/23/16 2159   09/23/16 2115  azithromycin (ZITHROMAX) 500 mg in dextrose 5 % 250 mL IVPB     500 mg 250 mL/hr over 60 Minutes Intravenous  Once 09/23/16 2104 09/23/16 2300        Subjective:   Joseph Barrera was seen and examined today.  Feels a lot better today, denies any specific complaints. No abdominal pain, fevers or chills, nausea or vomiting.  Patient denies dizziness, chest pain, new weakness, numbess, tingling. No acute events overnight.    Objective:   Vitals:   09/25/16 2358 09/26/16 0000 09/26/16 0400 09/26/16 0822  BP:  105/80 110/76   Pulse:  88 (!) 102   Resp:  19 (!) 22   Temp: 97.4 F (36.3 C)  97.5 F (36.4 C) 98.2 F (36.8 C)  TempSrc: Axillary  Axillary Oral  SpO2:  97% 98%   Weight:      Height:        Intake/Output Summary (Last 24 hours) at 09/26/16 1238 Last data filed at 09/25/16 2331  Gross per 24 hour  Intake              300 ml  Output              300 ml  Net                0 ml     Wt Readings from Last 3 Encounters:  09/23/16 118.1 kg (260 lb 6.4 oz)  09/12/16 111.6 kg (246 lb)  05/08/16 121.1 kg (267 lb)     Exam  General: Alert and oriented x 3, NAD  HEENT:    Neck: Supple, no JVD  Cardiovascular: S1 S2 Clear, irregularly irregular  Respiratory: Decreased breath sounds at the bases with some coarse rhonchi  Gastrointestinal: Obese, Soft, nontender, nondistended, NBS  Ext: no cyanosis clubbing, 1+ edema  Neuro: no new deficits  Skin: No rashes  Psych: Normal affect and demeanor, alert and oriented x3    Data Reviewed:  I have personally reviewed following labs and imaging studies  Micro Results Recent Results (from the past 240 hour(s))  Blood culture (routine x 2)     Status: None (Preliminary result)   Collection Time: 09/23/16  9:21 PM  Result Value Ref Range Status   Specimen Description BLOOD LEFT HAND  Final    Special Requests   Final    BOTTLES DRAWN AEROBIC AND ANAEROBIC Blood Culture adequate volume   Culture NO GROWTH 3 DAYS  Final   Report Status PENDING  Incomplete  Blood culture (routine x 2)     Status: None (Preliminary result)   Collection Time: 09/23/16  9:21 PM  Result Value Ref Range Status   Specimen Description BLOOD RIGHT WRIST  Final   Special Requests   Final    BOTTLES DRAWN AEROBIC AND ANAEROBIC Blood Culture adequate volume   Culture NO GROWTH 3 DAYS  Final   Report Status PENDING  Incomplete  MRSA PCR Screening     Status: Abnormal   Collection Time: 09/23/16 11:02 PM  Result Value Ref Range Status   MRSA by PCR POSITIVE (A) NEGATIVE Final    Comment:        The GeneXpert MRSA Assay (FDA approved for NASAL specimens only), is one component of a comprehensive MRSA colonization surveillance program. It is not intended to diagnose MRSA infection nor to guide or monitor treatment for MRSA infections. RESULT CALLED TO, READ BACK BY AND VERIFIED WITH: CORO,J RN [phone] 09/24/16 MITCHELL,L     Radiology Reports Dg Chest 2 View  Result Date: 09/12/2016 CLINICAL DATA:  Bilateral leg swelling.  History of CHF EXAM: CHEST  2 VIEW COMPARISON:  04/09/2016 FINDINGS: Cardiomegaly. Elevation of the right hemidiaphragm is stable. Mild vascular congestion. No overt edema. Bibasilar atelectasis noted. No visible effusions. IMPRESSION: Cardiomegaly with vascular congestion. Elevated right hemidiaphragm.  Bibasilar atelectasis. Electronically Signed  By: Charlett Nose M.D.   On: 09/12/2016 12:38   Ct Abdomen Pelvis W Contrast  Result Date: 09/24/2016 CLINICAL DATA:  Acute onset of generalized abdominal pain and shortness of breath. Initial encounter. EXAM: CT ABDOMEN AND PELVIS WITH CONTRAST TECHNIQUE: Multidetector CT imaging of the abdomen and pelvis was performed using the standard protocol following bolus administration of intravenous contrast. CONTRAST:  ISOVUE-300 IOPAMIDOL  (ISOVUE-300) INJECTION 61% COMPARISON:  CT of the abdomen and pelvis from 04/09/2016 FINDINGS: Lower chest: Mild bibasilar atelectasis or scarring is noted. The visualized portions of the mediastinum are grossly unremarkable. Hepatobiliary: Scattered calcified granulomata are noted within the liver. The liver is otherwise unremarkable. The patient is status post cholecystectomy, with clips noted at the gallbladder fossa. The common bile duct remains normal in caliber. Pancreas: The pancreas is within normal limits. Spleen: Scattered calcified granulomata are seen within the spleen. Adrenals/Urinary Tract: The adrenal glands are grossly unremarkable in appearance. Nonspecific perinephric stranding is noted bilaterally. A small right renal cyst is noted. There is no evidence of hydronephrosis. No renal or ureteral stones are identified. Stomach/Bowel: The stomach is unremarkable in appearance. The small bowel is within normal limits. The patient is status post appendectomy. The colon is unremarkable in appearance. Vascular/Lymphatic: Scattered calcification is seen along the abdominal aorta and its branches. The abdominal aorta is otherwise grossly unremarkable. The inferior vena cava is grossly unremarkable. No retroperitoneal lymphadenopathy is seen. No pelvic sidewall lymphadenopathy is identified. Reproductive: The bladder is decompressed, with a Foley catheter in place. Apparent bladder wall thickening may reflect cystitis. The prostate is normal in size. Other: No additional soft tissue abnormalities are seen. Musculoskeletal: No acute osseous abnormalities are identified. A left hip arthroplasty is grossly unremarkable in appearance, though incompletely imaged. Prominent cystic change is noted at the right ischium and ilium. Multilevel vacuum phenomenon is noted along the lower thoracic and lower lumbar spine. Underlying facet disease is noted. The visualized musculature is unremarkable in appearance.  IMPRESSION: 1. No acute abnormality seen to explain the patient's symptoms. 2. Apparent bladder wall thickening may reflect cystitis. 3. Mild bibasilar atelectasis or scarring noted. 4. Small right renal cyst noted. 5. Scattered aortic atherosclerosis. 6. Prominent cystic change at the right ischium and ilium, grossly unchanged from 2017. 7. Mild degenerative change along the lower thoracic and lower lumbar spine. Electronically Signed   By: Roanna Raider M.D.   On: 09/24/2016 00:51   Dg Chest Portable 1 View  Result Date: 09/23/2016 CLINICAL DATA:  SOB and entire abdominal pain. No chest pain. Also reports coughing up phlegm that's been going on for months. EXAM: PORTABLE CHEST 1 VIEW COMPARISON:  09/12/2016 FINDINGS: Mildly degraded exam due to AP portable technique and patient body habitus. Numerous leads and wires project over the chest. Midline trachea. Cardiomegaly accentuated by AP portable technique. Moderate right hemidiaphragm elevation. No pleural effusion or pneumothorax. Extremely low lung volumes, with resultant pulmonary interstitial prominence. Patchy left mid and lower lung pulmonary opacities are similar to the 04/09/2016 exam and likely a present on 09/12/2016. IMPRESSION: Cardiomegaly with persistent or recurrent patchy left-sided airspace disease. Suspicious for pneumonia. Chronic scarring or even calcified pleural plaques could have a similar appearance. Cardiomegaly with right hemidiaphragm elevation and low lung volumes. No congestive failure. Electronically Signed   By: Jeronimo Greaves M.D.   On: 09/23/2016 20:16    Lab Data:  CBC:  Recent Labs Lab 09/23/16 1947 09/25/16 0321 09/26/16 0629  WBC 13.7* 13.4* 9.7  NEUTROABS 10.3*  --   --  HGB 14.6 13.9 13.6  HCT 43.7 43.7 41.8  MCV 95.2 96.3 95.7  PLT 233 179 193   Basic Metabolic Panel:  Recent Labs Lab 09/23/16 1947 09/25/16 0321 09/26/16 0629  NA 133* 133* 133*  K 4.2 4.3 4.0  CL 98* 97* 95*  CO2 28 27 28     GLUCOSE 104* 95 93  BUN 13 14 14   CREATININE 0.91 0.78 0.80  CALCIUM 8.9 8.7* 8.6*   GFR: Estimated Creatinine Clearance: 96.3 mL/min (by C-G formula based on SCr of 0.8 mg/dL). Liver Function Tests:  Recent Labs Lab 09/23/16 1947  AST 24  ALT 11*  ALKPHOS 65  BILITOT 0.5  PROT 7.3  ALBUMIN 3.0*    Recent Labs Lab 09/23/16 1947  LIPASE 31   No results for input(s): AMMONIA in the last 168 hours. Coagulation Profile:  Recent Labs Lab 09/24/16 0026 09/25/16 0321 09/26/16 0629  INR 1.08 1.16 1.43   Cardiac Enzymes:  Recent Labs Lab 09/23/16 1947  TROPONINI <0.03   BNP (last 3 results) No results for input(s): PROBNP in the last 8760 hours. HbA1C: No results for input(s): HGBA1C in the last 72 hours. CBG:  Recent Labs Lab 09/24/16 1625 09/26/16 0941  GLUCAP 109* 144*   Lipid Profile: No results for input(s): CHOL, HDL, LDLCALC, TRIG, CHOLHDL, LDLDIRECT in the last 72 hours. Thyroid Function Tests:  Recent Labs  09/26/16 0629  TSH 1.249   Anemia Panel: No results for input(s): VITAMINB12, FOLATE, FERRITIN, TIBC, IRON, RETICCTPCT in the last 72 hours. Urine analysis:    Component Value Date/Time   COLORURINE YELLOW 09/23/2016 2112   APPEARANCEUR CLEAR 09/23/2016 2112   LABSPEC 1.010 09/23/2016 2112   PHURINE 7.0 09/23/2016 2112   GLUCOSEU NEGATIVE 09/23/2016 2112   HGBUR NEGATIVE 09/23/2016 2112   BILIRUBINUR NEGATIVE 09/23/2016 2112   KETONESUR NEGATIVE 09/23/2016 2112   PROTEINUR NEGATIVE 09/23/2016 2112   NITRITE NEGATIVE 09/23/2016 2112   LEUKOCYTESUR NEGATIVE 09/23/2016 2112     Georgina Krist M.D. [facility] 09/26/2016, 12:38 PM  Pager: [phone] Between 7am to 7pm - call Pager - [phone]  After 7pm go to [email or web address] - password [email or web address]  Call night coverage person covering after 7pm

## 2016-09-27 ENCOUNTER — Inpatient Hospital Stay (HOSPITAL_COMMUNITY): Payer: Medicare Other

## 2016-09-27 DIAGNOSIS — I361 Nonrheumatic tricuspid (valve) insufficiency: Secondary | ICD-10-CM

## 2016-09-27 LAB — ECHOCARDIOGRAM COMPLETE
Height: 72 in
WEIGHTICAEL: 4139.2 [oz_av]

## 2016-09-27 LAB — PROTIME-INR
INR: 1.62
Prothrombin Time: 19.4 seconds — ABNORMAL HIGH (ref 11.4–15.2)

## 2016-09-27 LAB — GLUCOSE, CAPILLARY: GLUCOSE-CAPILLARY: 90 mg/dL (ref 65–99)

## 2016-09-27 MED ORDER — ENOXAPARIN SODIUM 120 MG/0.8ML ~~LOC~~ SOLN: 120.0000 mg | Freq: Two times a day (BID) | SUBCUTANEOUS | 0 refills | Status: DC

## 2016-09-27 MED ORDER — TRAMADOL HCL 50 MG PO TABS: 50.0000 mg | ORAL_TABLET | Freq: Four times a day (QID) | ORAL | 0 refills | Status: AC | PRN

## 2016-09-27 MED ORDER — METOPROLOL TARTRATE 25 MG PO TABS
25.0000 mg | ORAL_TABLET | Freq: Three times a day (TID) | ORAL | 3 refills | Status: DC
Start: 2016-09-27 — End: 2016-11-01

## 2016-09-27 MED ORDER — CEFUROXIME AXETIL 500 MG PO TABS: 500.0000 mg | ORAL_TABLET | Freq: Two times a day (BID) | ORAL | 0 refills | Status: DC

## 2016-09-27 MED ORDER — WARFARIN SODIUM 7.5 MG PO TABS
7.5000 mg | ORAL_TABLET | Freq: Once | ORAL | Status: AC
  Administered 2016-09-27: 7.5 mg via ORAL
  Filled 2016-09-27: qty 1

## 2016-09-27 MED ORDER — MUPIROCIN 2 % EX OINT: 1.0000 "application " | TOPICAL_OINTMENT | Freq: Two times a day (BID) | CUTANEOUS | 0 refills | Status: DC

## 2016-09-27 MED ORDER — AZITHROMYCIN 500 MG PO TABS: 500.0000 mg | ORAL_TABLET | Freq: Every day | ORAL | 0 refills | Status: AC

## 2016-09-27 MED ORDER — AZITHROMYCIN 500 MG PO TABS
500.0000 mg | ORAL_TABLET | Freq: Every day | ORAL | Status: DC
  Administered 2016-09-27: 500 mg via ORAL
  Filled 2016-09-27: qty 1

## 2016-09-27 MED ORDER — ENOXAPARIN (LOVENOX) PATIENT EDUCATION KIT
PACK | Freq: Once | Status: AC
  Administered 2016-09-27: 16:00:00
  Filled 2016-09-27: qty 1

## 2016-09-27 MED ORDER — DM-GUAIFENESIN ER 30-600 MG PO TB12: 1.0000 | ORAL_TABLET | Freq: Two times a day (BID) | ORAL | 0 refills | Status: DC

## 2016-09-27 NOTE — Progress Notes (Signed)
[facility]                                                                              Patient Demographics  Joseph Barrera, is a 83 y.o. male, DOB - 1933/06/02, ZOX:[id number]  Admit date - 09/23/2016   Admitting Physician Lorretta Harp, MD  Outpatient Primary MD for the patient is Patient, No Pcp Per  Outpatient specialists:   LOS - 4  days    Chief Complaint  Patient presents with  . Abdominal Pain       Brief summary   Joseph Barrera is an 83 y.o. male with a PMH of chronic diastolic CHF, CAD status post CABG, atrial fibrillation on chronic Coumadin, history of TIA, RLS, GERD, depression, chronic respiratory failure on 2 L of supplemental oxygen at home, history pulmonary embolism who was admitted 09/23/16 with a chief complaint of worsening dyspnea over the course of one month, new onset of cough with clear/thick mucus, and a 2 year history of chronic intermittent abdominal pain that has gotten worse recently. He was found to be hypoxic with an oxygen saturation of 86% on admission and was placed on BiPAP. Chest x-ray showed patchy airspace disease on the left. Admitted to the SDU for further evaluation and treatment.   Assessment & Plan    Principal Problem:   CAP (community acquired pneumonia) complicated by acute on chronic respiratory failure with hypoxia - Patient required BiPAP overnight at the time of admission,Patient was successfully weaned off of the BiPAP. - - He was placed empirically on IV Rocephin and Zithromax. Mucinex and bronchodilators. - Influenza panel negative, urine strep antigen negative, blood cultures negative so far, urine Legionella antigen negative  - Lactic acid is not elevated.  - Chest x-ray showed cardiomegaly with left-sided airspace disease. - PTOT evaluation recommended DME rolling walker, continue ALF - Transition to oral Ceftin and Zithromax   Active Problems:   Atrial fibrillation with RVR (HCC) - CHA2DS2-VASc  Scoreis 6.  - Heart rate was controlled on beta blocker but however had two 6 second pauses on telemetry on 5/7 hence beta blocker was held. Beta blocker was restarted once heart rate was noticed to be elevated in the low 100s, on Lopressor 25 mg every 8 hours  (was on 50 mg 3 times a day prior to admission) - INR subtherapeutic. Coumadin per pharmacy, Lovenox bridging until INR therapeutic. - Cardiology consult was obtained. Patient was seen by Dr. Linde Gillis, recommended continue metoprolol at current dose and monitor heart rate when ambulating. Patient may need pacemaker in the future but prefer to place it as outpatient if he tolerates the metoprolol without further pauses. With possibility of pneumonia would like to avoid pacemaker implant with active infection. - Outpatient follow-up with EP cardiology has been scheduled on 10/11/16 per patient's request on Thursday when he has transportation from his assisted living facility. - 2-D echo pending today    History of Pulmonary embolism (HCC) - Continue Coumadin per pharmacy, added Lovenox bridging, INR 1.6. Patient is familiar with Lovenox. He was provided with Lovenox teaching by RN.    CAD (coronary artery disease) of artery  bypass graft -Troponin 0.03. Continue metoprolol.     chronic Abdominal pain -Currently resolved, possibly from constipation. Lipase WNL. CT of the abdomen and pelvis negative for acute findings other than bladder wall thickening. Pain may be from this, constipation, or referred pain from mild degenerative disc disease or cystic changes in the right ischium/ileum. -  Morphine ineffective, continue Toradol as needed and low dose hydrocodone. Patient has had this pain for 2 years, and has become a bit irritable with the nurses that we have not figured out what is wrong. He also seems to think he needs surgery for this. Patient states that he had extensive workup done in [place] about his abdominal pain. I recommended patient  to follow-up with his outpatient PCP to avoid duplicating the same workup. He does not have any abdominal pain at this time and agrees with the plan.    GERD (gastroesophageal reflux disease) -Continue Protonix.    Depression -Continue Requip.    Chronic diastolic CHF (congestive heart failure) (HCC) - No pulmonary edema noted on chest radiography. Appears to be compensated.  - Continue home dose of Lasix, spironolactone and metoprolol.    Obesity, Class II, BMI 35.0-39.9, with comorbidity (see actual BMI) -Body mass index is 36.32 kg/m.     MRSA carrier - Continue Bactroban application    Chronic venous stasis ulcers Wound care nurse removed unna boots, blisters have healed and there are no other open wounds or edema, so unna boots have been discontinued.  Code Status: full  DVT Prophylaxis:  Warfarin  Family Communication: Discussed in detail with the patient, all imaging results, lab results explained to the patient   Disposition Plan: PT recommended DME rolling walker  Time Spent in minutes   25 minutes  Procedures:  None  Consultants:   EP cardiology  Antimicrobials:   IV Zithromax 5/6 >  IV Rocephin 5/6  >   Medications  Scheduled Meds: . azithromycin  500 mg Oral QHS  . B-complex with vitamin C  1 tablet Oral Daily  . Chlorhexidine Gluconate Cloth  6 each Topical Q0600  . cholecalciferol  5,000 Units Oral Daily  . cyanocobalamin  500 mcg Oral Daily  . dextromethorphan-guaiFENesin  1 tablet Oral BID  . dicyclomine  20 mg Oral Q6H  . enoxaparin (LOVENOX) injection  120 mg Subcutaneous Q12H  . enoxaparin   Does not apply Once  . furosemide  40 mg Oral BID  . gabapentin  600 mg Oral QHS  . loratadine  10 mg Oral Daily  . mouth rinse  15 mL Mouth Rinse BID  . metoprolol tartrate  25 mg Oral Q8H  . multivitamin with minerals  1 tablet Oral Daily  . mupirocin ointment  1 application Nasal BID  . pantoprazole  40 mg Oral Daily  . rOPINIRole  4 mg  Oral QHS  . spironolactone  50 mg Oral BID  . warfarin  7.5 mg Oral ONCE-1800  . Warfarin - Pharmacist Dosing Inpatient   Does not apply q1800   Continuous Infusions: . cefTRIAXone (ROCEPHIN)  IV Stopped (09/26/16 2330)   PRN Meds:.acetaminophen, docusate sodium, HYDROcodone-acetaminophen, hydrOXYzine, ipratropium, ketorolac, levalbuterol, morphine injection, polyethylene glycol powder, traMADol, zolpidem   Antibiotics   Anti-infectives    Start     Dose/Rate Route Frequency Ordered Stop   09/27/16 2200  azithromycin (ZITHROMAX) tablet 500 mg     500 mg Oral Daily at bedtime 09/27/16 1016 10/01/16 2159   09/27/16 0000  azithromycin (ZITHROMAX) 500 MG  tablet     500 mg Oral Daily 09/27/16 1054 10/02/16 2359   09/27/16 0000  cefUROXime (CEFTIN) 500 MG tablet     500 mg Oral 2 times daily with meals 09/27/16 1054     09/24/16 2200  cefTRIAXone (ROCEPHIN) 1 g in dextrose 5 % 50 mL IVPB     1 g 100 mL/hr over 30 Minutes Intravenous Every 24 hours 09/23/16 2231 10/01/16 2159   09/24/16 2200  azithromycin (ZITHROMAX) 500 mg in dextrose 5 % 250 mL IVPB  Status:  Discontinued     500 mg 250 mL/hr over 60 Minutes Intravenous Every 24 hours 09/23/16 2231 09/27/16 1015   09/23/16 2115  cefTRIAXone (ROCEPHIN) 1 g in dextrose 5 % 50 mL IVPB     1 g 100 mL/hr over 30 Minutes Intravenous  Once 09/23/16 2104 09/23/16 2159   09/23/16 2115  azithromycin (ZITHROMAX) 500 mg in dextrose 5 % 250 mL IVPB     500 mg 250 mL/hr over 60 Minutes Intravenous  Once 09/23/16 2104 09/23/16 2300        Subjective:   Windy FastRonald Larose was seen and examined today. Feeling better, sitting up in the chair, has been ambulating without any difficulty.  No abdominal pain, no chest pain or shortness of breath. Patient denies dizziness, chest pain, new weakness, numbess, tingling. No acute events overnight.    Objective:   Vitals:   09/27/16 0645 09/27/16 0701 09/27/16 1114 09/27/16 1335  BP: 107/68  (!) 90/55 106/60    Pulse: 78  89 100  Resp: 18  16 18   Temp: 97.5 F (36.4 C)  98.9 F (37.2 C) 98.8 F (37.1 C)  TempSrc: Oral  Oral Oral  SpO2: 95%   93%  Weight:  117.3 kg (258 lb 11.2 oz)    Height:        Intake/Output Summary (Last 24 hours) at 09/27/16 1421 Last data filed at 09/27/16 1336  Gross per 24 hour  Intake             1140 ml  Output             1701 ml  Net             -561 ml     Wt Readings from Last 3 Encounters:  09/27/16 117.3 kg (258 lb 11.2 oz)  09/12/16 111.6 kg (246 lb)  05/08/16 121.1 kg (267 lb)     Exam  General: Alert and oriented x 3, NAD  HEENT:    Neck: Supple, no JVD  Cardiovascular: S1 and S2 clear, irregularly irregular.  Respiratory: Decreased breath sounds at the bases otherwise fairly clear  Gastrointestinal: Soft, nontender, nondistended, normal bowel sounds  Ext: no cyanosis, clubbing, edema  Neuro: strength 5 x 5 in upper and lower extremities bilaterally  Skin: No rashes  Psych: Normal affect and demeanor, alert and oriented x3    Data Reviewed:  I have personally reviewed following labs and imaging studies  Micro Results Recent Results (from the past 240 hour(s))  Blood culture (routine x 2)     Status: None (Preliminary result)   Collection Time: 09/23/16  9:21 PM  Result Value Ref Range Status   Specimen Description BLOOD LEFT HAND  Final   Special Requests   Final    BOTTLES DRAWN AEROBIC AND ANAEROBIC Blood Culture adequate volume   Culture NO GROWTH 4 DAYS  Final   Report Status PENDING  Incomplete  Blood culture (routine x  2)     Status: None (Preliminary result)   Collection Time: 09/23/16  9:21 PM  Result Value Ref Range Status   Specimen Description BLOOD RIGHT WRIST  Final   Special Requests   Final    BOTTLES DRAWN AEROBIC AND ANAEROBIC Blood Culture adequate volume   Culture NO GROWTH 4 DAYS  Final   Report Status PENDING  Incomplete  MRSA PCR Screening     Status: Abnormal   Collection Time: 09/23/16  11:02 PM  Result Value Ref Range Status   MRSA by PCR POSITIVE (A) NEGATIVE Final    Comment:        The GeneXpert MRSA Assay (FDA approved for NASAL specimens only), is one component of a comprehensive MRSA colonization surveillance program. It is not intended to diagnose MRSA infection nor to guide or monitor treatment for MRSA infections. RESULT CALLED TO, READ BACK BY AND VERIFIED WITH: CORO,J RN [phone] 09/24/16 MITCHELL,L     Radiology Reports Dg Chest 2 View  Result Date: 09/12/2016 CLINICAL DATA:  Bilateral leg swelling.  History of CHF EXAM: CHEST  2 VIEW COMPARISON:  04/09/2016 FINDINGS: Cardiomegaly. Elevation of the right hemidiaphragm is stable. Mild vascular congestion. No overt edema. Bibasilar atelectasis noted. No visible effusions. IMPRESSION: Cardiomegaly with vascular congestion. Elevated right hemidiaphragm.  Bibasilar atelectasis. Electronically Signed   By: Charlett Nose M.D.   On: 09/12/2016 12:38   Ct Abdomen Pelvis W Contrast  Result Date: 09/24/2016 CLINICAL DATA:  Acute onset of generalized abdominal pain and shortness of breath. Initial encounter. EXAM: CT ABDOMEN AND PELVIS WITH CONTRAST TECHNIQUE: Multidetector CT imaging of the abdomen and pelvis was performed using the standard protocol following bolus administration of intravenous contrast. CONTRAST:  ISOVUE-300 IOPAMIDOL (ISOVUE-300) INJECTION 61% COMPARISON:  CT of the abdomen and pelvis from 04/09/2016 FINDINGS: Lower chest: Mild bibasilar atelectasis or scarring is noted. The visualized portions of the mediastinum are grossly unremarkable. Hepatobiliary: Scattered calcified granulomata are noted within the liver. The liver is otherwise unremarkable. The patient is status post cholecystectomy, with clips noted at the gallbladder fossa. The common bile duct remains normal in caliber. Pancreas: The pancreas is within normal limits. Spleen: Scattered calcified granulomata are seen within the spleen.  Adrenals/Urinary Tract: The adrenal glands are grossly unremarkable in appearance. Nonspecific perinephric stranding is noted bilaterally. A small right renal cyst is noted. There is no evidence of hydronephrosis. No renal or ureteral stones are identified. Stomach/Bowel: The stomach is unremarkable in appearance. The small bowel is within normal limits. The patient is status post appendectomy. The colon is unremarkable in appearance. Vascular/Lymphatic: Scattered calcification is seen along the abdominal aorta and its branches. The abdominal aorta is otherwise grossly unremarkable. The inferior vena cava is grossly unremarkable. No retroperitoneal lymphadenopathy is seen. No pelvic sidewall lymphadenopathy is identified. Reproductive: The bladder is decompressed, with a Foley catheter in place. Apparent bladder wall thickening may reflect cystitis. The prostate is normal in size. Other: No additional soft tissue abnormalities are seen. Musculoskeletal: No acute osseous abnormalities are identified. A left hip arthroplasty is grossly unremarkable in appearance, though incompletely imaged. Prominent cystic change is noted at the right ischium and ilium. Multilevel vacuum phenomenon is noted along the lower thoracic and lower lumbar spine. Underlying facet disease is noted. The visualized musculature is unremarkable in appearance. IMPRESSION: 1. No acute abnormality seen to explain the patient's symptoms. 2. Apparent bladder wall thickening may reflect cystitis. 3. Mild bibasilar atelectasis or scarring noted. 4. Small right renal cyst  noted. 5. Scattered aortic atherosclerosis. 6. Prominent cystic change at the right ischium and ilium, grossly unchanged from 2017. 7. Mild degenerative change along the lower thoracic and lower lumbar spine. Electronically Signed   By: Roanna Raider M.D.   On: 09/24/2016 00:51   Dg Chest Portable 1 View  Result Date: 09/23/2016 CLINICAL DATA:  SOB and entire abdominal pain. No  chest pain. Also reports coughing up phlegm that's been going on for months. EXAM: PORTABLE CHEST 1 VIEW COMPARISON:  09/12/2016 FINDINGS: Mildly degraded exam due to AP portable technique and patient body habitus. Numerous leads and wires project over the chest. Midline trachea. Cardiomegaly accentuated by AP portable technique. Moderate right hemidiaphragm elevation. No pleural effusion or pneumothorax. Extremely low lung volumes, with resultant pulmonary interstitial prominence. Patchy left mid and lower lung pulmonary opacities are similar to the 04/09/2016 exam and likely a present on 09/12/2016. IMPRESSION: Cardiomegaly with persistent or recurrent patchy left-sided airspace disease. Suspicious for pneumonia. Chronic scarring or even calcified pleural plaques could have a similar appearance. Cardiomegaly with right hemidiaphragm elevation and low lung volumes. No congestive failure. Electronically Signed   By: Jeronimo Greaves M.D.   On: 09/23/2016 20:16    Lab Data:  CBC:  Recent Labs Lab 09/23/16 1947 09/25/16 0321 09/26/16 0629  WBC 13.7* 13.4* 9.7  NEUTROABS 10.3*  --   --   HGB 14.6 13.9 13.6  HCT 43.7 43.7 41.8  MCV 95.2 96.3 95.7  PLT 233 179 193   Basic Metabolic Panel:  Recent Labs Lab 09/23/16 1947 09/25/16 0321 09/26/16 0629  NA 133* 133* 133*  K 4.2 4.3 4.0  CL 98* 97* 95*  CO2 28 27 28   GLUCOSE 104* 95 93  BUN 13 14 14   CREATININE 0.91 0.78 0.80  CALCIUM 8.9 8.7* 8.6*   GFR: Estimated Creatinine Clearance: 97.4 mL/min (by C-G formula based on SCr of 0.8 mg/dL). Liver Function Tests:  Recent Labs Lab 09/23/16 1947  AST 24  ALT 11*  ALKPHOS 65  BILITOT 0.5  PROT 7.3  ALBUMIN 3.0*    Recent Labs Lab 09/23/16 1947  LIPASE 31   No results for input(s): AMMONIA in the last 168 hours. Coagulation Profile:  Recent Labs Lab 09/24/16 0026 09/25/16 0321 09/26/16 0629 09/27/16 0352  INR 1.08 1.16 1.43 1.62   Cardiac Enzymes:  Recent Labs Lab  09/23/16 1947  TROPONINI <0.03   BNP (last 3 results) No results for input(s): PROBNP in the last 8760 hours. HbA1C: No results for input(s): HGBA1C in the last 72 hours. CBG:  Recent Labs Lab 09/24/16 1625 09/26/16 0941 09/27/16 0901  GLUCAP 109* 144* 90   Lipid Profile: No results for input(s): CHOL, HDL, LDLCALC, TRIG, CHOLHDL, LDLDIRECT in the last 72 hours. Thyroid Function Tests:  Recent Labs  09/26/16 0629  TSH 1.249   Anemia Panel: No results for input(s): VITAMINB12, FOLATE, FERRITIN, TIBC, IRON, RETICCTPCT in the last 72 hours. Urine analysis:    Component Value Date/Time   COLORURINE YELLOW 09/23/2016 2112   APPEARANCEUR CLEAR 09/23/2016 2112   LABSPEC 1.010 09/23/2016 2112   PHURINE 7.0 09/23/2016 2112   GLUCOSEU NEGATIVE 09/23/2016 2112   HGBUR NEGATIVE 09/23/2016 2112   BILIRUBINUR NEGATIVE 09/23/2016 2112   KETONESUR NEGATIVE 09/23/2016 2112   PROTEINUR NEGATIVE 09/23/2016 2112   NITRITE NEGATIVE 09/23/2016 2112   LEUKOCYTESUR NEGATIVE 09/23/2016 2112     Michale Emmerich M.D. [facility] 09/27/2016, 2:21 PM  Pager: [phone] Between 7am to 7pm - call  Pager - [phone]  After 7pm go to [email or web address] - password [email or web address]  Call night coverage person covering after 7pm

## 2016-09-27 NOTE — Clinical Social Work Note (Signed)
Clinical Social Work Assessment  Patient Details  Name: Joseph Barrera MRN: [id number] Date of Birth: 1933-06-01  Date of referral:  09/27/16               Reason for consult:  Discharge Planning                Permission sought to share information with:  Chartered certified accountant granted to share information::  Yes, Verbal Permission Granted  Name::        Agency::  [facility]  Relationship::     Contact Information:     Housing/Transportation Living arrangements for the past 2 months:  [place] of Information:  Patient, Medical Team Patient Interpreter Needed:  None Criminal Activity/Legal Involvement Pertinent to Current Situation/Hospitalization:  No - Comment as needed Significant Relationships:  Adult Children Lives with:  Facility Resident Do you feel safe going back to the place where you live?  Yes Need for family participation in patient care:  Yes (Comment)  Care giving concerns:  Patient is a resident at [facility].   Social Worker assessment / plan:  CSW met with patient. No supports at bedside. CSW introduced role and explained that discharge planning would be discussed. Patient confirmed that he is from [facility] and plans to return once stable for discharge. Patient confirmed he will need PTAR. Emergency contact notes that his daughter Roselyn Reef is his legal guardian but CSW unable to locate any paperwork to show this and there is no further mention of it in his notes. No further concerns. CSW encouraged patient to contact CSW as needed. CSW will continue to follow patient for support and facilitate discharge back to ALF once medically stable.  Employment status:  Retired Forensic scientist:  Medicare PT Recommendations:  Not assessed at this time [place] / Referral to community resources:  Other (Comment Required) (Patient plans to return to ALF)  Patient/Family's Response to care:  Patient  agreeable to return to ALF. Patient's children supportive and involved in patient's care. Patient appreciated social work intervention.  Patient/Family's Understanding of and Emotional Response to Diagnosis, Current Treatment, and Prognosis:  Patient has a good understanding of the reason for admission. Patient appears happy with hospital care.  Emotional Assessment Appearance:  Appears stated age Attitude/Demeanor/Rapport:  Other (Pleasant) Affect (typically observed):  Accepting, Appropriate, Calm, Pleasant Orientation:  Oriented to Self, Oriented to Place, Oriented to  Time, Oriented to Situation Alcohol / Substance use:  Never Used Psych involvement (Current and /or in the community):  No (Comment)  Discharge Needs  Concerns to be addressed:  Care Coordination Readmission within the last 30 days:  No Current discharge risk:  None Barriers to Discharge:  Continued Medical Work up   Candie Chroman, LCSW 09/27/2016, 10:29 AM

## 2016-09-27 NOTE — NC FL2 (Deleted)
[place] LEVEL OF CARE SCREENING TOOL     IDENTIFICATION  Patient Name: Joseph Barrera Birthdate: Oct 02, 1933 Sex: male Admission Date (Current Location): 09/23/2016  [facility] and [place] Number:  Herbalist and Address:  [facility] [place], [place] [address] [place], [place] [address]      Provider Number: [id number]  Attending Physician Name and Address:  Mendel Corning, MD  Relative Name and Phone Number:       Current Level of Care: Other (Comment) ([facility]) Recommended Level of Care: [place] Prior Approval Number:    Date Approved/Denied:   PASRR Number:    Discharge Plan: Other (Comment) ([facility])    Current Diagnoses: Patient Active Problem List   Diagnosis Date Noted  . Obesity, Class II, BMI 35.0-39.9, with comorbidity (see actual BMI) 09/24/2016  . MRSA carrier 09/24/2016  . Venous stasis of both lower extremities 09/24/2016  . Sinus pause 09/24/2016  . CAP (community acquired pneumonia) 09/23/2016  . Acute on chronic respiratory failure with hypoxia ([place]) 09/23/2016  . Abdominal pain 09/23/2016  . GERD (gastroesophageal reflux disease) 09/23/2016  . Depression 09/23/2016  . Chronic diastolic CHF (congestive heart failure) ([place]) 09/23/2016  . Atrial fibrillation with RVR ([place]) 04/06/2016  . History of pulmonary embolism 04/06/2016  . Restless legs syndrome (RLS) 04/06/2016  . Sleep hypopnea 04/06/2016  . Constipation 04/06/2016    Orientation RESPIRATION BLADDER Height & Weight     Self, Time, Situation, Place  Normal, O2 (2L prn) Continent Weight: 258 lb 11.2 oz (117.3 kg) Height:  6' (182.9 cm)  BEHAVIORAL SYMPTOMS/MOOD NEUROLOGICAL BOWEL NUTRITION STATUS      Continent  (HEART HEALTHY)  AMBULATORY STATUS COMMUNICATION OF NEEDS Skin   Limited Assist Verbally Normal                       Personal Care Assistance Level of Assistance  Bathing, Feeding,  Dressing Bathing Assistance: Limited assistance Feeding assistance: Independent Dressing Assistance: Limited assistance     Functional Limitations Info  Sight, Hearing, Speech Sight Info: Adequate Hearing Info: Adequate Speech Info: Adequate    SPECIAL CARE FACTORS FREQUENCY  (S) PT (By licensed PT) (hh pt)                    Contractures Contractures Info: Not present    Additional Factors Info  Code Status, Allergies (full)   Allergies Info: oxycodone, benadryl (oxycodone)           Current Medications (09/27/2016):  This is the current hospital active medication list Current Facility-Administered Medications  Medication Dose Route Frequency Provider Last Rate Last Dose  . acetaminophen (TYLENOL) tablet 650 mg  650 mg Oral Q6H PRN Ivor Costa, MD      . azithromycin [facility]) tablet 500 mg  500 mg Oral QHS Rai, Ripudeep K, MD      . B-complex with vitamin C tablet 1 tablet  1 tablet Oral Daily Ivor Costa, MD   1 tablet at 09/27/16 1112  . cefTRIAXone (ROCEPHIN) 1 g in dextrose 5 % 50 mL IVPB  1 g Intravenous Q24H Ivor Costa, MD   Stopped at 09/26/16 2330  . Chlorhexidine Gluconate Cloth 2 % PADS 6 each  6 each Topical Q0600 Ivor Costa, MD   6 each at 09/27/16 [phone]  . cholecalciferol (VITAMIN D) tablet 5,000 Units  5,000 Units Oral Daily Ivor Costa, MD   5,000  Units at 09/27/16 1112  . cyanocobalamin tablet 500 mcg  500 mcg Oral Daily Ivor Costa, MD   500 mcg at 09/27/16 1128  . dextromethorphan-guaiFENesin (MUCINEX DM) 30-600 MG per 12 hr tablet 1 tablet  1 tablet Oral BID Ivor Costa, MD   1 tablet at 09/27/16 1111  . dicyclomine (BENTYL) tablet 20 mg  20 mg Oral Q6H Ivor Costa, MD   20 mg at 09/27/16 1112  . docusate sodium (COLACE) capsule 100 mg  100 mg Oral BID PRN Ivor Costa, MD      . enoxaparin (LOVENOX) injection 120 mg  120 mg Subcutaneous Q12H Franky Macho, RPH   120 mg at 09/27/16 1703  . enoxaparin (LOVENOX) patient education kit   Does not apply Once  Rai, Ripudeep K, MD      . furosemide (LASIX) tablet 40 mg  40 mg Oral BID Ivor Costa, MD   40 mg at 09/27/16 1703  . gabapentin (NEURONTIN) capsule 600 mg  600 mg Oral QHS Ivor Costa, MD   600 mg at 09/26/16 2056  . HYDROcodone-acetaminophen (NORCO/VICODIN) 5-325 MG per tablet 1 tablet  1 tablet Oral Q4H PRN Rama, Venetia Maxon, MD      . hydrOXYzine (ATARAX/VISTARIL) tablet 10 mg  10 mg Oral TID PRN Ivor Costa, MD      . ipratropium (ATROVENT) nebulizer solution 0.5 mg  0.5 mg Nebulization Q6H PRN Rama, Venetia Maxon, MD      . ketorolac (TORADOL) 15 MG/ML injection 15 mg  15 mg Intravenous Q6H PRN Rama, Venetia Maxon, MD      . levalbuterol (XOPENEX) nebulizer solution 1.25 mg  1.25 mg Nebulization Q6H PRN Rama, Venetia Maxon, MD      . loratadine (CLARITIN) tablet 10 mg  10 mg Oral Daily Ivor Costa, MD   10 mg at 09/27/16 1111  . MEDLINE mouth rinse  15 mL Mouth Rinse BID Rai, Ripudeep K, MD   15 mL at 09/27/16 1654  . metoprolol tartrate (LOPRESSOR) tablet 25 mg  25 mg Oral Q8H Rama, Venetia Maxon, MD   Stopped at 09/27/16 1652  . morphine 4 MG/ML injection 1 mg  1 mg Intravenous Q2H PRN Ivor Costa, MD   1 mg at 09/24/16 1420  . multivitamin with minerals tablet 1 tablet  1 tablet Oral Daily Ivor Costa, MD   1 tablet at 09/27/16 1112  . mupirocin ointment (BACTROBAN) 2 % 1 application  1 application Nasal BID Ivor Costa, MD   1 application at 35/45/62 1112  . pantoprazole (PROTONIX) EC tablet 40 mg  40 mg Oral Daily Ivor Costa, MD   40 mg at 09/27/16 1111  . polyethylene glycol powder (GLYCOLAX/MIRALAX) container 255 g  1 Container Oral Daily PRN Ivor Costa, MD      . rOPINIRole (REQUIP) tablet 4 mg  4 mg Oral QHS Ivor Costa, MD   4 mg at 09/26/16 2055  . spironolactone (ALDACTONE) tablet 50 mg  50 mg Oral BID Ivor Costa, MD   50 mg at 09/27/16 1111  . traMADol (ULTRAM) tablet 50 mg  50 mg Oral Q6H PRN Ivor Costa, MD      . warfarin (COUMADIN) tablet 7.5 mg  7.5 mg Oral ONCE-1800 Rai, Ripudeep K, MD       . Warfarin - Pharmacist Dosing Inpatient   Does not apply q1800 Otilio Miu, RPH      . zolpidem (AMBIEN) tablet 5 mg  5 mg Oral QHS PRN Ivor Costa,  MD         Discharge Medications: Please see discharge summary for a list of discharge medications.  Relevant Imaging Results:  Relevant Lab Results:   Additional Information  Sherlock Nancarrow M, LCSW

## 2016-09-27 NOTE — Evaluation (Signed)
Physical Therapy Evaluation Patient Details Name: Joseph Barrera MRN: [id number] DOB: 1933/12/04 Today's Date: 09/27/2016   History of Present Illness  83 yo male with onset of CAP, with CHF and a-fib with sinus pauses was admitted from ALF.  He is noting general weakness and concern about receiving care at ALF.  PMHx:  L shoulder fx, CHF, a-fib, TIA, CAD and RLS  Clinical Impression  Pt is getting limited services at ALF but has access to PT per his report.  He is ready to progress gait and transfers with care for a-fib up to 115 with rest, but did not surpass 100 after gait.  Will follow up with him acutely for these goals, and then transition to HHPT when discharged from hospital.  Will need RW for stability as he is using a rollator and does not have enough standing stability to safely do so yet.    Follow Up Recommendations Home health PT;Supervision for mobility/OOB    Equipment Recommendations  Rolling walker with 5" wheels    Recommendations for Other Services       Precautions / Restrictions Precautions Precautions: Fall Precaution Comments: on rollator prior to this  Restrictions Weight Bearing Restrictions: No      Mobility  Bed Mobility Overal bed mobility: Needs Assistance Bed Mobility: Supine to Sit     Supine to sit: Min guard     General bed mobility comments: elevated HOB  Transfers Overall transfer level: Needs assistance Equipment used: Rolling walker (2 wheeled);1 person hand held assist Transfers: Sit to/from [employer] Sit to Stand: Min guard Stand pivot transfers: Min guard       General transfer comment: guarded due to his symptoms and weakness  Ambulation/Gait Ambulation/Gait assistance: Min guard Ambulation Distance (Feet): 45 Feet Assistive device: Rolling walker (2 wheeled);1 person hand held assist Gait Pattern/deviations: Step-through pattern;Step-to pattern;Decreased stride length;Wide base of support;Trunk  flexed;Shuffle Gait velocity: reduced Gait velocity interpretation: Below normal speed for age/gender General Gait Details: slow progression as pt is having some fatigue with effort but able to do with minor guarding.  Stairs            Wheelchair Mobility    Modified Rankin (Stroke Patients Only)       Balance Overall balance assessment: Needs assistance Sitting-balance support: Feet supported Sitting balance-Leahy Scale: Good     Standing balance support: Bilateral upper extremity supported Standing balance-Leahy Scale: Fair                               Pertinent Vitals/Pain Pain Assessment: No/denies pain    Home Living Family/patient expects to be discharged to:: Assisted living               Home Equipment: Walker - 4 wheels;Bedside commode;Shower seat - built in      Prior Function Level of Independence: Needs assistance   Gait / Transfers Assistance Needed: rollator for short trips in ALF  ADL's / Homemaking Assistance Needed: has help for cooking and housekeeping        Hand Dominance        Extremity/Trunk Assessment   Upper Extremity Assessment Upper Extremity Assessment: Overall WFL for tasks assessed;LUE deficits/detail LUE Deficits / Details: L shoulder ROM to reach over head limited by old fx LUE Coordination: decreased gross motor    Lower Extremity Assessment Lower Extremity Assessment: Generalized weakness    Cervical / Trunk Assessment Cervical / Trunk  Assessment: Kyphotic  Communication   Communication: No difficulties  Cognition Arousal/Alertness: Awake/alert Behavior During Therapy: WFL for tasks assessed/performed Overall Cognitive Status: Within Functional Limits for tasks assessed                                        General Comments General comments (skin integrity, edema, etc.): pt is up to walk with minor help but is looking like he needs to be supervised.  Has to set up turns to  sit with RW as he is unable to control descent without help    Exercises     Assessment/Plan    PT Assessment Patient needs continued PT services  PT Problem List Decreased strength;Decreased range of motion;Decreased activity tolerance;Decreased balance;Decreased mobility;Decreased coordination;Decreased knowledge of use of DME;Decreased safety awareness;Cardiopulmonary status limiting activity;Decreased skin integrity       PT Treatment Interventions DME instruction;Gait training;Functional mobility training;Therapeutic activities;Therapeutic exercise;Balance training;Neuromuscular re-education;Patient/family education    PT Goals (Current goals can be found in the Care Plan section)  Acute Rehab PT Goals Patient Stated Goal: to get home  PT Goal Formulation: With patient Time For Goal Achievement: 10/11/16 Potential to Achieve Goals: Good    Frequency Min 3X/week   Barriers to discharge Decreased caregiver support (reports he does not get enough help for bathing and dressing)      Co-evaluation               AM-PAC PT "6 Clicks" Daily Activity  Outcome Measure Difficulty turning over in bed (including adjusting bedclothes, sheets and blankets)?: A Little Difficulty moving from lying on back to sitting on the side of the bed? : A Little Difficulty sitting down on and standing up from a chair with arms (e.g., wheelchair, bedside commode, etc,.)?: A Little Help needed moving to and from a bed to chair (including a wheelchair)?: A Little Help needed walking in hospital room?: A Little Help needed climbing 3-5 steps with a railing? : A Lot 6 Click Score: 17    End of Session Equipment Utilized During Treatment: Gait belt (pt has cannula off, no drops in O2 sats with effort) Activity Tolerance: Patient limited by fatigue;Patient limited by lethargy Patient left: in chair;with call bell/phone within reach Nurse Communication: Mobility status PT Visit Diagnosis:  Unsteadiness on feet (R26.81);Difficulty in walking, not elsewhere classified (R26.2)    Time: [id number] PT Time Calculation (min) (ACUTE ONLY): 24 min   Charges:   PT Evaluation $PT Eval Moderate Complexity: 1 Procedure PT Treatments $Gait Training: 8-22 mins   PT G Codes:   PT G-Codes **NOT FOR INPATIENT CLASS** Functional Assessment Tool Used: AM-PAC 6 Clicks Basic Mobility    Ivar DrapeRuth E Verdell Kincannon 09/27/2016, 11:10 AM   Samul Dadauth Charlesa Ehle, PT MS Acute Rehab Dept. Number: [facility] [phone] and [facility] [phone]

## 2016-09-27 NOTE — Progress Notes (Signed)
Waiting on paper work for pt to go back to ALF, SW on the Case.

## 2016-09-27 NOTE — Discharge Summary (Signed)
Physician Discharge Summary   Patient ID: Joseph Barrera MRN: [id number] DOB/AGE: [birth year] 83 y.o.  Admit date: 09/23/2016 Discharge date: 09/27/2016  Primary Care Physician:  Patient, No Pcp Per  Discharge Diagnoses:    . Atrial fibrillation with RVR (HCC) and sinus pauses  . History of pulmonary embolism . CAP (community acquired pneumonia) . Acute on chronic respiratory failure with hypoxia (HCC) . Abdominal pain . GERD (gastroesophageal reflux disease) . Depression . Chronic diastolic CHF (congestive heart failure) (HCC) . Obesity, Class II, BMI 35.0-39.9, with comorbidity (see actual BMI) . Venous stasis of both lower extremities . Sinus pause   Consults: cardiology, Dr Raul Delaminitz   Recommendations for Outpatient Follow-up:   1. Please repeat CBC/BMET at next visit 2. Patient is started on lovenox injections to bridge with coumadin until INR is above 2   DIET: heart health diet     Allergies:   Allergies  Allergen Reactions  . Oxycodone Other (See Comments)    nightmares  . Benadryl [Diphenhydramine]      DISCHARGE MEDICATIONS: Current Discharge Medication List    START taking these medications   Details  azithromycin (ZITHROMAX) 500 MG tablet Take 1 tablet (500 mg total) by mouth daily. Qty: 5 tablet, Refills: 0    cefUROXime (CEFTIN) 500 MG tablet Take 1 tablet (500 mg total) by mouth 2 (two) times daily with a meal. X 5 more days Qty: 10 tablet, Refills: 0    dextromethorphan-guaiFENesin (MUCINEX DM) 30-600 MG 12hr tablet Take 1 tablet by mouth 2 (two) times daily. Qty: 30 tablet, Refills: 0    enoxaparin (LOVENOX) 120 MG/0.8ML injection Inject 0.8 mLs (120 mg total) into the skin every 12 (twelve) hours. Continue until INR above 2, then stop Qty: 8 Syringe, Refills: 0    mupirocin ointment (BACTROBAN) 2 % Place 1 application into the nose 2 (two) times daily. For mupirocin (BACTROBAN) Nasal - with cotton tip swab, apply pea sized amount into  each nare.  Gently press sides of nose together to spread oint. AVOID contact with eyes. For 2 more days Qty: 22 g, Refills: 0      CONTINUE these medications which have CHANGED   Details  metoprolol tartrate (LOPRESSOR) 25 MG tablet Take 1 tablet (25 mg total) by mouth every 8 (eight) hours. Qty: 90 tablet, Refills: 3    traMADol (ULTRAM) 50 MG tablet Take 1 tablet (50 mg total) by mouth every 6 (six) hours as needed for moderate pain. Qty: 20 tablet, Refills: 0      CONTINUE these medications which have NOT CHANGED   Details  b complex vitamins capsule Take 1 capsule by mouth daily.    cetirizine (ZYRTEC) 10 MG tablet Take 10 mg by mouth daily.    Cholecalciferol (VITAMIN D3 ULTRA STRENGTH) 5000 units capsule Take 5,000 Units by mouth daily.    dicyclomine (BENTYL) 20 MG tablet Take 20 mg by mouth every 6 (six) hours.    docusate sodium (COLACE) 100 MG capsule Take 100 mg by mouth 2 (two) times daily as needed for mild constipation.    furosemide (LASIX) 40 MG tablet Take 40 mg by mouth 2 (two) times daily.    gabapentin (NEURONTIN) 300 MG capsule Take 300mg  nightly for 1 week then increase to 2 tablets nightly. Qty: 21 capsule, Refills: 3    ipratropium-albuterol (DUONEB) 0.5-2.5 (3) MG/3ML SOLN Take 3 mLs by nebulization every 6 (six) hours as needed.    Multiple Vitamins-Minerals (MULTIVITAMIN ADULT) TABS Take 1 tablet  by mouth daily.    omeprazole (PRILOSEC) 20 MG capsule Take 20 mg by mouth daily.    OXYGEN Inhale 2 L into the lungs daily as needed (SOB).    polyethylene glycol powder (GLYCOLAX/MIRALAX) powder Mix 17g in 8oz of water twice daily. May titrate as needed. Qty: 255 g, Refills: 3    potassium chloride SA (K-DUR,KLOR-CON) 20 MEQ tablet Take 20 mEq by mouth 2 (two) times daily.    rOPINIRole (REQUIP) 4 MG tablet Take 4 mg by mouth at bedtime.    spironolactone (ALDACTONE) 50 MG tablet Take 50 mg by mouth 2 (two) times daily.    vitamin B-12  (CYANOCOBALAMIN) 500 MCG tablet Take 500 mcg by mouth daily.    warfarin (COUMADIN) 7.5 MG tablet Take 7.5 mg by mouth daily.         Brief H and P: For complete details please refer to admission H and P, but in briefRonald Charles Barrera an 83 y.o.malewith a PMH of chronic diastolic CHF, CAD status post CABG, atrial fibrillation on chronic Coumadin, history of TIA, RLS, GERD, depression, chronic respiratory failure on 2 L of supplemental oxygen at home, history pulmonary embolism who was admitted 09/23/16 with a chief complaint of worsening dyspnea over the course of one month, new onset of cough with clear/thick mucus, and a 2 year history of chronic intermittent abdominal pain that has gotten worse recently. He was found to be hypoxic with an oxygen saturation of 86% on admission and was placed on BiPAP. Chest x-ray showed patchy airspace disease on the left. Admitted to the SDU for further evaluation and treatment.   Hospital Course:  CAP (community acquired pneumonia) complicated by acute on chronic respiratory failure with hypoxia - Patient required BiPAP overnight at the time of admission,Patient was successfully weaned off of the BiPAP. - - He was placed empirically on IV Rocephin and Zithromax. Mucinex and bronchodilators. - Influenza panel negative, urine strep antigen negative, blood cultures negative so far, urine Legionella antigen negative  - Lactic acid is not elevated.  - Chest x-ray showed cardiomegaly with left-sided airspace disease. - PTOT evaluation recommended DME rolling walker, continue ALF - Transition to oral Ceftin and Zithromax   Active Problems: Atrial fibrillation with RVR (HCC) - CHA2DS2-VASc Scoreis 6.  - Heart rate was controlled on beta blocker but however had two 6 second pauses on telemetry on 5/7 hence beta blocker was held. Beta blocker was restarted once heart rate was noticed to be elevated in the low 100s, on Lopressor 25 mg every 8 hours  (was  on 50 mg 3 times a day prior to admission) - INR subtherapeutic. Coumadin per pharmacy, Lovenox bridging until INR therapeutic. - Cardiology consult was obtained. Patient was seen by Dr. Linde Gillis, recommended continue metoprolol at current dose and monitor heart rate when ambulating. Patient may need pacemaker in the future but prefer to place it as outpatient if he tolerates the metoprolol without further pauses. With possibility of pneumonia would like to avoid pacemaker implant with active infection. - Outpatient follow-up with EP cardiology has been scheduled on 10/11/16 per patient's request on Thursday when he has transportation from his assisted living facility. - 2-D echo showed EF 65-70%, normal wall motion  History of Pulmonary embolism (HCC) - Continue Coumadin per pharmacy, added Lovenox bridging, INR 1.6. Patient is familiar with Lovenox. He was provided with Lovenox teaching by RN.  CAD (coronary artery disease) of artery bypass graft -Troponin 0.03. Continue metoprolol.   chronic Abdominal  pain -Currently resolved, possibly from constipation. Lipase WNL. CT of the abdomen and pelvis negative for acute findings other than bladder wall thickening. Pain may be from this, constipation, or referred pain from mild degenerative disc disease or cystic changes in the right ischium/ileum. - Morphine ineffective, continue Toradol as needed and low dose hydrocodone.Patient has had this pain for 2 years, and has become a bit irritable with the nurses that we have not figured out what is wrong. He also seems to think he needs surgery for this. Patient states that he had extensive workup done in [place] about his abdominal pain. - I recommended patient to follow-up with his outpatient PCP to avoid duplicating the same workup. He does not have any abdominal pain at this time and agrees with the plan.  GERD (gastroesophageal reflux disease) -Continue Protonix.  Depression -Continue  Requip.  Chronic diastolic CHF (congestive heart failure) (HCC) - No pulmonary edema noted on chest radiography. Appears to be compensated.  - Continue home dose of Lasix, spironolactone  Obesity, Class II, BMI 35.0-39.9, with comorbidity (see actual BMI) -Body mass index is 36.32 kg/m.  MRSA carrier - Continue Bactroban application  Chronic venous stasis ulcers Wound care nurse removed unna boots, blisters have healed and there are no other open wounds or edema, so unna boots have been discontinued.   Day of Discharge BP 106/60 (BP Location: Right Arm)   Pulse 100   Temp 98.8 F (37.1 C) (Oral)   Resp 18   Ht 6' (1.829 m)   Wt 117.3 kg (258 lb 11.2 oz)   SpO2 93%   BMI 35.09 kg/m   Physical Exam: General: Alert and awake oriented x3 not in any acute distress. HEENT: anicteric sclera, pupils reactive to light and accommodation CVS: S1-S2 clear no murmur rubs or gallops Chest: decreased BS at bases otherwise fairly clear Abdomen: soft nontender, nondistended, normal bowel sounds Extremities: no cyanosis, clubbing or edema noted bilaterally Neuro: Cranial nerves II-XII intact, no focal neurological deficits   The results of significant diagnostics from this hospitalization (including imaging, microbiology, ancillary and laboratory) are listed below for reference.    LAB RESULTS: Basic Metabolic Panel:  Recent Labs Lab 09/25/16 0321 09/26/16 0629  NA 133* 133*  K 4.3 4.0  CL 97* 95*  CO2 27 28  GLUCOSE 95 93  BUN 14 14  CREATININE 0.78 0.80  CALCIUM 8.7* 8.6*   Liver Function Tests:  Recent Labs Lab 09/23/16 1947  AST 24  ALT 11*  ALKPHOS 65  BILITOT 0.5  PROT 7.3  ALBUMIN 3.0*    Recent Labs Lab 09/23/16 1947  LIPASE 31   No results for input(s): AMMONIA in the last 168 hours. CBC:  Recent Labs Lab 09/23/16 1947 09/25/16 0321 09/26/16 0629  WBC 13.7* 13.4* 9.7  NEUTROABS 10.3*  --   --   HGB 14.6 13.9 13.6  HCT 43.7 43.7  41.8  MCV 95.2 96.3 95.7  PLT 233 179 193   Cardiac Enzymes:  Recent Labs Lab 09/23/16 1947  TROPONINI <0.03   BNP: Invalid input(s): POCBNP CBG:  Recent Labs Lab 09/26/16 0941 09/27/16 0901  GLUCAP 144* 90    Significant Diagnostic Studies:  Ct Abdomen Pelvis W Contrast  Result Date: 09/24/2016 CLINICAL DATA:  Acute onset of generalized abdominal pain and shortness of breath. Initial encounter. EXAM: CT ABDOMEN AND PELVIS WITH CONTRAST TECHNIQUE: Multidetector CT imaging of the abdomen and pelvis was performed using the standard protocol following bolus administration of intravenous  contrast. CONTRAST:  ISOVUE-300 IOPAMIDOL (ISOVUE-300) INJECTION 61% COMPARISON:  CT of the abdomen and pelvis from 04/09/2016 FINDINGS: Lower chest: Mild bibasilar atelectasis or scarring is noted. The visualized portions of the mediastinum are grossly unremarkable. Hepatobiliary: Scattered calcified granulomata are noted within the liver. The liver is otherwise unremarkable. The patient is status post cholecystectomy, with clips noted at the gallbladder fossa. The common bile duct remains normal in caliber. Pancreas: The pancreas is within normal limits. Spleen: Scattered calcified granulomata are seen within the spleen. Adrenals/Urinary Tract: The adrenal glands are grossly unremarkable in appearance. Nonspecific perinephric stranding is noted bilaterally. A small right renal cyst is noted. There is no evidence of hydronephrosis. No renal or ureteral stones are identified. Stomach/Bowel: The stomach is unremarkable in appearance. The small bowel is within normal limits. The patient is status post appendectomy. The colon is unremarkable in appearance. Vascular/Lymphatic: Scattered calcification is seen along the abdominal aorta and its branches. The abdominal aorta is otherwise grossly unremarkable. The inferior vena cava is grossly unremarkable. No retroperitoneal lymphadenopathy is seen. No pelvic  sidewall lymphadenopathy is identified. Reproductive: The bladder is decompressed, with a Foley catheter in place. Apparent bladder wall thickening may reflect cystitis. The prostate is normal in size. Other: No additional soft tissue abnormalities are seen. Musculoskeletal: No acute osseous abnormalities are identified. A left hip arthroplasty is grossly unremarkable in appearance, though incompletely imaged. Prominent cystic change is noted at the right ischium and ilium. Multilevel vacuum phenomenon is noted along the lower thoracic and lower lumbar spine. Underlying facet disease is noted. The visualized musculature is unremarkable in appearance. IMPRESSION: 1. No acute abnormality seen to explain the patient's symptoms. 2. Apparent bladder wall thickening may reflect cystitis. 3. Mild bibasilar atelectasis or scarring noted. 4. Small right renal cyst noted. 5. Scattered aortic atherosclerosis. 6. Prominent cystic change at the right ischium and ilium, grossly unchanged from 2017. 7. Mild degenerative change along the lower thoracic and lower lumbar spine. Electronically Signed   By: Roanna Raider M.D.   On: 09/24/2016 00:51   Dg Chest Portable 1 View  Result Date: 09/23/2016 CLINICAL DATA:  SOB and entire abdominal pain. No chest pain. Also reports coughing up phlegm that's been going on for months. EXAM: PORTABLE CHEST 1 VIEW COMPARISON:  09/12/2016 FINDINGS: Mildly degraded exam due to AP portable technique and patient body habitus. Numerous leads and wires project over the chest. Midline trachea. Cardiomegaly accentuated by AP portable technique. Moderate right hemidiaphragm elevation. No pleural effusion or pneumothorax. Extremely low lung volumes, with resultant pulmonary interstitial prominence. Patchy left mid and lower lung pulmonary opacities are similar to the 04/09/2016 exam and likely a present on 09/12/2016. IMPRESSION: Cardiomegaly with persistent or recurrent patchy left-sided airspace  disease. Suspicious for pneumonia. Chronic scarring or even calcified pleural plaques could have a similar appearance. Cardiomegaly with right hemidiaphragm elevation and low lung volumes. No congestive failure. Electronically Signed   By: Jeronimo Greaves M.D.   On: 09/23/2016 20:16    2D ECHO: Study Conclusions  - Left ventricle: The cavity size was normal. There was mild focal   basal hypertrophy of the septum. Systolic function was vigorous.   The estimated ejection fraction was in the range of 65% to 70%.   Wall motion was normal; there were no regional wall motion   abnormalities. - Left atrium: The atrium was moderately dilated. - Right ventricle: The cavity size was moderately dilated. Wall   thickness was normal. - Right atrium: The atrium  was mildly dilated. - Tricuspid valve: There was mild regurgitation. - Pericardium, extracardiac: A trivial pericardial effusion was   identified.  Disposition and Follow-up: Discharge Instructions    Diet - low sodium heart healthy    Complete by:  As directed    Discharge instructions    Complete by:  As directed    Please continue lovenox injections twice a day, check INR tomorrow and on Monday. If INR above 2, you can stop lovenox injections and continue coumadin.   Increase activity slowly    Complete by:  As directed        DISPOSITION: ALF   DISCHARGE FOLLOW-UP Follow-up Information    Regan Lemming, MD Follow up on 10/11/2016.   Specialty:  Cardiology Why:  11:30AM  Contact information: [address] [place] [address] [phone]            Time spent on Discharge:   Signed:   Thad Ranger M.D. [facility] 09/27/2016, 5:05 PM Pager: [phone]

## 2016-09-27 NOTE — NC FL2 (Signed)
[place] LEVEL OF CARE SCREENING TOOL     IDENTIFICATION  Patient Name: Joseph Barrera Birthdate: Oct 02, 1933 Sex: male Admission Date (Current Location): 09/23/2016  [facility] and [place] Number:  Herbalist and Address:  [facility] [place] [place] [address] [place], [place] [address]      Provider Number: [id number]  Attending Physician Name and Address:  Mendel Corning, MD  Relative Name and Phone Number:       Current Level of Care: Other (Comment) ([facility]) Recommended Level of Care: [place] Prior Approval Number:    Date Approved/Denied:   PASRR Number:    Discharge Plan: Other (Comment) ([facility])    Current Diagnoses: Patient Active Problem List   Diagnosis Date Noted  . Obesity, Class II, BMI 35.0-39.9, with comorbidity (see actual BMI) 09/24/2016  . MRSA carrier 09/24/2016  . Venous stasis of both lower extremities 09/24/2016  . Sinus pause 09/24/2016  . CAP (community acquired pneumonia) 09/23/2016  . Acute on chronic respiratory failure with hypoxia ([place]) 09/23/2016  . Abdominal pain 09/23/2016  . GERD (gastroesophageal reflux disease) 09/23/2016  . Depression 09/23/2016  . Chronic diastolic CHF (congestive heart failure) ([place]) 09/23/2016  . Atrial fibrillation with RVR ([place]) 04/06/2016  . History of pulmonary embolism 04/06/2016  . Restless legs syndrome (RLS) 04/06/2016  . Sleep hypopnea 04/06/2016  . Constipation 04/06/2016    Orientation RESPIRATION BLADDER Height & Weight     Self, Time, Situation, Place  Normal, O2 (2L prn) Continent Weight: 258 lb 11.2 oz (117.3 kg) Height:  6' (182.9 cm)  BEHAVIORAL SYMPTOMS/MOOD NEUROLOGICAL BOWEL NUTRITION STATUS      Continent  (HEART HEALTHY)  AMBULATORY STATUS COMMUNICATION OF NEEDS Skin   Limited Assist Verbally Normal                       Personal Care Assistance Level of Assistance  Bathing, Feeding,  Dressing Bathing Assistance: Limited assistance Feeding assistance: Independent Dressing Assistance: Limited assistance     Functional Limitations Info  Sight, Hearing, Speech Sight Info: Adequate Hearing Info: Adequate Speech Info: Adequate    SPECIAL CARE FACTORS FREQUENCY  (S) PT (By licensed PT) (hh pt)                    Contractures Contractures Info: Not present    Additional Factors Info  Code Status, Allergies (full)   Allergies Info: oxycodone, benadryl (oxycodone)           Current Medications (09/27/2016):  This is the current hospital active medication list Current Facility-Administered Medications  Medication Dose Route Frequency Provider Last Rate Last Dose  . acetaminophen (TYLENOL) tablet 650 mg  650 mg Oral Q6H PRN Ivor Costa, MD      . azithromycin [facility]) tablet 500 mg  500 mg Oral QHS Rai, Ripudeep K, MD      . B-complex with vitamin C tablet 1 tablet  1 tablet Oral Daily Ivor Costa, MD   1 tablet at 09/27/16 1112  . cefTRIAXone (ROCEPHIN) 1 g in dextrose 5 % 50 mL IVPB  1 g Intravenous Q24H Ivor Costa, MD   Stopped at 09/26/16 2330  . Chlorhexidine Gluconate Cloth 2 % PADS 6 each  6 each Topical Q0600 Ivor Costa, MD   6 each at 09/27/16 [phone]  . cholecalciferol (VITAMIN D) tablet 5,000 Units  5,000 Units Oral Daily Ivor Costa, MD   5,000  Units at 09/27/16 1112  . cyanocobalamin tablet 500 mcg  500 mcg Oral Daily Ivor Costa, MD   500 mcg at 09/27/16 1128  . dextromethorphan-guaiFENesin (MUCINEX DM) 30-600 MG per 12 hr tablet 1 tablet  1 tablet Oral BID Ivor Costa, MD   1 tablet at 09/27/16 1111  . dicyclomine (BENTYL) tablet 20 mg  20 mg Oral Q6H Ivor Costa, MD   20 mg at 09/27/16 1112  . docusate sodium (COLACE) capsule 100 mg  100 mg Oral BID PRN Ivor Costa, MD      . enoxaparin (LOVENOX) injection 120 mg  120 mg Subcutaneous Q12H Franky Macho, RPH   120 mg at 09/27/16 1703  . enoxaparin (LOVENOX) patient education kit   Does not apply Once  Rai, Ripudeep K, MD      . furosemide (LASIX) tablet 40 mg  40 mg Oral BID Ivor Costa, MD   40 mg at 09/27/16 1703  . gabapentin (NEURONTIN) capsule 600 mg  600 mg Oral QHS Ivor Costa, MD   600 mg at 09/26/16 2056  . HYDROcodone-acetaminophen (NORCO/VICODIN) 5-325 MG per tablet 1 tablet  1 tablet Oral Q4H PRN Rama, Venetia Maxon, MD      . hydrOXYzine (ATARAX/VISTARIL) tablet 10 mg  10 mg Oral TID PRN Ivor Costa, MD      . ipratropium (ATROVENT) nebulizer solution 0.5 mg  0.5 mg Nebulization Q6H PRN Rama, Venetia Maxon, MD      . ketorolac (TORADOL) 15 MG/ML injection 15 mg  15 mg Intravenous Q6H PRN Rama, Venetia Maxon, MD      . levalbuterol (XOPENEX) nebulizer solution 1.25 mg  1.25 mg Nebulization Q6H PRN Rama, Venetia Maxon, MD      . loratadine (CLARITIN) tablet 10 mg  10 mg Oral Daily Ivor Costa, MD   10 mg at 09/27/16 1111  . MEDLINE mouth rinse  15 mL Mouth Rinse BID Rai, Ripudeep K, MD   15 mL at 09/27/16 1654  . metoprolol tartrate (LOPRESSOR) tablet 25 mg  25 mg Oral Q8H Rama, Venetia Maxon, MD   Stopped at 09/27/16 1652  . morphine 4 MG/ML injection 1 mg  1 mg Intravenous Q2H PRN Ivor Costa, MD   1 mg at 09/24/16 1420  . multivitamin with minerals tablet 1 tablet  1 tablet Oral Daily Ivor Costa, MD   1 tablet at 09/27/16 1112  . mupirocin ointment (BACTROBAN) 2 % 1 application  1 application Nasal BID Ivor Costa, MD   1 application at 35/45/62 1112  . pantoprazole (PROTONIX) EC tablet 40 mg  40 mg Oral Daily Ivor Costa, MD   40 mg at 09/27/16 1111  . polyethylene glycol powder (GLYCOLAX/MIRALAX) container 255 g  1 Container Oral Daily PRN Ivor Costa, MD      . rOPINIRole (REQUIP) tablet 4 mg  4 mg Oral QHS Ivor Costa, MD   4 mg at 09/26/16 2055  . spironolactone (ALDACTONE) tablet 50 mg  50 mg Oral BID Ivor Costa, MD   50 mg at 09/27/16 1111  . traMADol (ULTRAM) tablet 50 mg  50 mg Oral Q6H PRN Ivor Costa, MD      . warfarin (COUMADIN) tablet 7.5 mg  7.5 mg Oral ONCE-1800 Rai, Ripudeep K, MD       . Warfarin - Pharmacist Dosing Inpatient   Does not apply q1800 Otilio Miu, RPH      . zolpidem (AMBIEN) tablet 5 mg  5 mg Oral QHS PRN Ivor Costa,  MD         Discharge Medications: Please see discharge summary for a list of discharge medications.  Relevant Imaging Results:  Relevant Lab Results:   Additional Information CONTINUE these medications which have [place], Jetson Pickrel M, LCSW

## 2016-09-27 NOTE — Progress Notes (Signed)
ANTICOAGULATION CONSULT NOTE - Follow Up Consult  Pharmacy Consult for Coumadin and Lovenox Indication: atrial fibrillation and h/o PE 03/2016  Allergies  Allergen Reactions  . Oxycodone Other (See Comments)    nightmares  . Benadryl [Diphenhydramine]     Patient Measurements: Height: 6' (182.9 cm) Weight: 258 lb 11.2 oz (117.3 kg) IBW/kg (Calculated) : 77.6  Vital Signs: Temp: 97.5 F (36.4 C) (05/10 0645) Temp Source: Oral (05/10 0645) BP: 107/68 (05/10 0645) Pulse Rate: 78 (05/10 0645)  Labs:  Recent Labs  09/25/16 0321 09/26/16 0629 09/27/16 0352  HGB 13.9 13.6  --   HCT 43.7 41.8  --   PLT 179 193  --   LABPROT 14.9 17.6* 19.4*  INR 1.16 1.43 1.62  CREATININE 0.78 0.80  --     Estimated Creatinine Clearance: 97.4 mL/min (by C-G formula based on SCr of 0.8 mg/dL).  Assessment: 82yom on coumadin pta for afib and hx PE. Admit INR 1.08 and lovenox bridge added.  INR = 1.62 (continues to trend up) *Coumadin dose missed 5/6 as he was in the ED and consult not ordered until early 5/7 AM*  Home dose: 7.5mg  daily - last taken 5/5  Goal of Therapy:  Anti-Xa level 0.6-1.2 (4h after LMWH given) INR 2-3 Monitor platelets by anticoagulation protocol: Yes   Plan:  1) Repeat Coumadin 7.5mg  tonight 2) Continue lovenox 120mg  sq q12 3) Daily INR, CBC q72 at least  Thank you Okey RegalLisa Amadou Katzenstein, PharmD [phone]  Elwin SleightPowell, Shaniqua Guillot Kay 09/27/2016,10:17 AM

## 2016-09-27 NOTE — NC FL2 (Signed)
[place] LEVEL OF CARE SCREENING TOOL     IDENTIFICATION  Patient Name: Joseph Barrera Birthdate: 08-17-1933 Sex: male Admission Date (Current Location): 09/23/2016  [facility] and [place] Number:  Herbalist and Address:  [facility] [place] [address] [place], [place] [address]      Provider Number: [id number]  Attending Physician Name and Address:  Mendel Corning, MD  Relative Name and Phone Number:       Current Level of Care: Other (Comment) ([facility]) Recommended Level of Care: [place] Prior Approval Number:    Date Approved/Denied:   PASRR Number:    Discharge Plan: Other (Comment) ([facility])    Current Diagnoses: Patient Active Problem List   Diagnosis Date Noted  . Obesity, Class II, BMI 35.0-39.9, with comorbidity (see actual BMI) 09/24/2016  . MRSA carrier 09/24/2016  . Venous stasis of both lower extremities 09/24/2016  . Sinus pause 09/24/2016  . CAP (community acquired pneumonia) 09/23/2016  . Acute on chronic respiratory failure with hypoxia ([place]) 09/23/2016  . Abdominal pain 09/23/2016  . GERD (gastroesophageal reflux disease) 09/23/2016  . Depression 09/23/2016  . Chronic diastolic CHF (congestive heart failure) ([place]) 09/23/2016  . Atrial fibrillation with RVR ([place]) 04/06/2016  . History of pulmonary embolism 04/06/2016  . Restless legs syndrome (RLS) 04/06/2016  . Sleep hypopnea 04/06/2016  . Constipation 04/06/2016    Orientation RESPIRATION BLADDER Height & Weight     Self, Time, Situation, Place  Normal, O2 (2L prn) Continent Weight: 117.3 kg (258 lb 11.2 oz) Height:  6' (182.9 cm)  BEHAVIORAL SYMPTOMS/MOOD NEUROLOGICAL BOWEL NUTRITION STATUS      Continent  (HEART HEALTHY)  AMBULATORY STATUS COMMUNICATION OF NEEDS Skin   Limited Assist Verbally Normal                       Personal Care Assistance Level of Assistance  Bathing, Feeding,  Dressing Bathing Assistance: Limited assistance Feeding assistance: Independent Dressing Assistance: Limited assistance     Functional Limitations Info  Sight, Hearing, Speech Sight Info: Adequate Hearing Info: Adequate Speech Info: Adequate    SPECIAL CARE FACTORS FREQUENCY  (S) PT (By licensed PT) (hh pt)                    Contractures Contractures Info: Not present    Additional Factors Info  Code Status, Allergies (full)   Allergies Info: oxycodone, benadryl (oxycodone)           Current Medications (09/27/2016):  This is the current hospital active medication list Current Facility-Administered Medications  Medication Dose Route Frequency Provider Last Rate Last Dose  . acetaminophen (TYLENOL) tablet 650 mg  650 mg Oral Q6H PRN Ivor Costa, MD      . azithromycin [facility]) tablet 500 mg  500 mg Oral QHS Agusta Hackenberg K, MD      . B-complex with vitamin C tablet 1 tablet  1 tablet Oral Daily Ivor Costa, MD   1 tablet at 09/27/16 1112  . cefTRIAXone (ROCEPHIN) 1 g in dextrose 5 % 50 mL IVPB  1 g Intravenous Q24H Ivor Costa, MD   Stopped at 09/26/16 2330  . Chlorhexidine Gluconate Cloth 2 % PADS 6 each  6 each Topical Q0600 Ivor Costa, MD   6 each at 09/27/16 [phone]  . cholecalciferol (VITAMIN D) tablet 5,000 Units  5,000 Units Oral Daily Ivor Costa, MD   5,000  Units at 09/27/16 1112  . cyanocobalamin tablet 500 mcg  500 mcg Oral Daily Ivor Costa, MD   500 mcg at 09/27/16 1128  . dextromethorphan-guaiFENesin (MUCINEX DM) 30-600 MG per 12 hr tablet 1 tablet  1 tablet Oral BID Ivor Costa, MD   1 tablet at 09/27/16 1111  . dicyclomine (BENTYL) tablet 20 mg  20 mg Oral Q6H Ivor Costa, MD   20 mg at 09/27/16 1801  . docusate sodium (COLACE) capsule 100 mg  100 mg Oral BID PRN Ivor Costa, MD      . enoxaparin (LOVENOX) injection 120 mg  120 mg Subcutaneous Q12H Franky Macho, RPH   120 mg at 09/27/16 1703  . enoxaparin (LOVENOX) patient education kit   Does not apply Once  Jazia Faraci K, MD      . furosemide (LASIX) tablet 40 mg  40 mg Oral BID Ivor Costa, MD   40 mg at 09/27/16 1703  . gabapentin (NEURONTIN) capsule 600 mg  600 mg Oral QHS Ivor Costa, MD   600 mg at 09/26/16 2056  . HYDROcodone-acetaminophen (NORCO/VICODIN) 5-325 MG per tablet 1 tablet  1 tablet Oral Q4H PRN Rama, Venetia Maxon, MD      . hydrOXYzine (ATARAX/VISTARIL) tablet 10 mg  10 mg Oral TID PRN Ivor Costa, MD      . ipratropium (ATROVENT) nebulizer solution 0.5 mg  0.5 mg Nebulization Q6H PRN Rama, Venetia Maxon, MD      . ketorolac (TORADOL) 15 MG/ML injection 15 mg  15 mg Intravenous Q6H PRN Rama, Venetia Maxon, MD      . levalbuterol (XOPENEX) nebulizer solution 1.25 mg  1.25 mg Nebulization Q6H PRN Rama, Venetia Maxon, MD      . loratadine (CLARITIN) tablet 10 mg  10 mg Oral Daily Ivor Costa, MD   10 mg at 09/27/16 1111  . MEDLINE mouth rinse  15 mL Mouth Rinse BID Andru Genter K, MD   15 mL at 09/27/16 1654  . metoprolol tartrate (LOPRESSOR) tablet 25 mg  25 mg Oral Q8H Rama, Venetia Maxon, MD   Stopped at 09/27/16 1652  . morphine 4 MG/ML injection 1 mg  1 mg Intravenous Q2H PRN Ivor Costa, MD   1 mg at 09/24/16 1420  . multivitamin with minerals tablet 1 tablet  1 tablet Oral Daily Ivor Costa, MD   1 tablet at 09/27/16 1112  . mupirocin ointment (BACTROBAN) 2 % 1 application  1 application Nasal BID Ivor Costa, MD   1 application at 07/86/75 1112  . pantoprazole (PROTONIX) EC tablet 40 mg  40 mg Oral Daily Ivor Costa, MD   40 mg at 09/27/16 1111  . polyethylene glycol powder (GLYCOLAX/MIRALAX) container 255 g  1 Container Oral Daily PRN Ivor Costa, MD      . rOPINIRole (REQUIP) tablet 4 mg  4 mg Oral QHS Ivor Costa, MD   4 mg at 09/26/16 2055  . spironolactone (ALDACTONE) tablet 50 mg  50 mg Oral BID Ivor Costa, MD   50 mg at 09/27/16 1111  . traMADol (ULTRAM) tablet 50 mg  50 mg Oral Q6H PRN Ivor Costa, MD      . warfarin (COUMADIN) tablet 7.5 mg  7.5 mg Oral ONCE-1800 Shalamar Plourde K, MD       . Warfarin - Pharmacist Dosing Inpatient   Does not apply q1800 Otilio Miu, RPH      . zolpidem (AMBIEN) tablet 5 mg  5 mg Oral QHS PRN Ivor Costa,  MD         Discharge Medications: Please see discharge summary for a list of discharge medications.  Relevant Imaging Results:  Relevant Lab Results:   Additional Information CONTINUE these medications which have CHANGED  Ramzi Brathwaite, MD

## 2016-09-27 NOTE — Progress Notes (Signed)
Pt is alert and oriented pleasant in the chair. No complaints of pain, b/p-90/55 no dizziness stated that he does not want to take vitamins they are too expensive.

## 2016-09-27 NOTE — Progress Notes (Signed)
D/c paperwork prepared and faxed to facility, however the facility expressed concerns re: pt's Lovenox as they could not give the injections.  [facility]ond[facility] of the facility was not sure about [facility], being able to manage BID injections.  CSW discussed with MD, who believes pt can do own injections, but will post-pone d/c until a firm plan can be arranged in the am.  Unit CSW to be updated and will facilitate d/c back to [facility].  Pollyann SavoyJody Arieh Bogue, LCSW ED/Evening Coverage [phone]

## 2016-09-28 LAB — CULTURE, BLOOD (ROUTINE X 2)
CULTURE: NO GROWTH
Culture: NO GROWTH
SPECIAL REQUESTS: ADEQUATE
Special Requests: ADEQUATE

## 2016-09-28 LAB — PROTIME-INR
INR: 1.54
INR: 1.6
PROTHROMBIN TIME: 19.2 s — AB (ref 11.4–15.2)
Prothrombin Time: 18.6 seconds — ABNORMAL HIGH (ref 11.4–15.2)

## 2016-09-28 MED ORDER — WARFARIN SODIUM 10 MG PO TABS: 10.0000 mg | ORAL_TABLET | Freq: Once | ORAL | Status: DC

## 2016-09-28 NOTE — Progress Notes (Signed)
   09/28/16 0800  SLP Visit Information  SLP Received On 09/28/16  General Information  HPI Joseph Barrera an 83 y.o.malewith a PMH of chronic diastolic CHF, CAD status post CABG, atrial fibrillation on chronic Coumadin, history of TIA, RLS, GERD, depression, chronic respiratory failure on 2 L of supplemental oxygen at home, history pulmonary embolism who was admitted 09/23/16 with a chief complaint of worsening dyspnea over the course of one month, new onset of cough with clear/thick mucus, and a 2 year history of chronic intermittent abdominal pain that has gotten worse recently. He was found to be hypoxic with an oxygen saturation of 86% on admission and was placed on BiPAP. Chest x-ray showed patchy airspace disease on the left.  Pt observed to cough with liquids.  Type of Study Bedside Swallow Evaluation  Previous Swallow Assessment none  Diet Prior to this Study Regular;Thin liquids  Temperature Spikes Noted No  Respiratory Status Nasal cannula  History of Recent Intubation No  Behavior/Cognition Alert;Cooperative;Pleasant mood  Oral Cavity Assessment WFL  Oral Care Completed by SLP No  Oral Cavity - Dentition Adequate natural dentition  Vision Functional for self-feeding  Self-Feeding Abilities Able to feed self  Patient Positioning Upright in bed  Baseline Vocal Quality Normal  Volitional Cough Strong  Volitional Swallow Able to elicit  Oral Motor/Sensory Function  Overall Oral Motor/Sensory Function WFL  Thin Liquid  Thin Liquid WFL  Presentation Cup;Straw;Self Fed  Nectar Thick Liquid  Nectar Thick Liquid NT  Honey Thick Liquid  Honey Thick Liquid NT  Puree  Puree WFL  Solid  Solid [facility]  SLP Assessment  Clinical Impression Statement (ACUTE ONLY) Pt demonstrates no evidence of aspiration under observation with 3 oz othinl iquids consumed consectively and during intake of meal. Pt reports that he does have to be careful when eating; if he drinks cold liquids he is  likely to regurgitate water and if he is not fully supright he may choke. Pt does has thick heave abdominal mass that would impede respiration if not upright. Advised pt to follow his instincts about best foods and precautions for him and practice self advocacy while in the hospital to ask for what he needs. Recommend pt resume a regualr diet and thin liquids with upright posture, avoiding trigger foods. No SLP f/u needed, will sign off.   SLP Visit Diagnosis Dysphagia, unspecified (R13.10)  Impact on safety and function Mild aspiration risk  Swallow Evaluation Recommendations  SLP Diet Recommendations Regular;Thin liquid  Liquid Administration via Cup;Straw  Supervision Patient able to self feed  Compensations Slow rate;Small sips/bites  Postural Changes Seated upright at 90 degrees  Treatment Plan  Oral Care Recommendations Oral care BID  Treatment Recommendations No treatment recommended at this time  Follow up Recommendations None  Individuals Consulted  Consulted and Agree with Results and Recommendations Patient;RN  Progression Toward Goals  Progression toward goals Goals met, education completed, patient discharged from SLP  SLP Time Calculation  SLP Start Time (ACUTE ONLY) 0920  SLP Stop Time (ACUTE ONLY) 3716  SLP Time Calculation (min) (ACUTE ONLY) 17 min  SLP Evaluations  $ SLP Speech Visit 1 Procedure  SLP Evaluations  $BSS Swallow 1 Procedure

## 2016-09-28 NOTE — Progress Notes (Signed)
[facility]

## 2016-09-28 NOTE — Progress Notes (Signed)
Pt. Coughs/chokes with thin liquids. On call for TRH, Robb Matarrtiz, MD notified via text page. RN will continue to monitor. Yutaka Holberg, Cheryll DessertKaren Cherrell

## 2016-09-28 NOTE — Discharge Summary (Addendum)
Physician Discharge Summary   Patient ID: Nysir Fergusson MRN: [id number] DOB/AGE: [birth year] 83 y.o.  Admit date: 09/23/2016 Discharge date: 09/28/2016  Primary Care Physician:  Patient, No Pcp Per  Discharge Diagnoses:    . Atrial fibrillation with RVR (HCC) and sinus pauses  . History of pulmonary embolism . CAP (community acquired pneumonia) . Acute on chronic respiratory failure with hypoxia (HCC) . Abdominal pain . GERD (gastroesophageal reflux disease) . Depression . Chronic diastolic CHF (congestive heart failure) (HCC) . Obesity, Class II, BMI 35.0-39.9, with comorbidity (see actual BMI) . Venous stasis of both lower extremities . Sinus pause   Consults: cardiology, Dr Raul Del   Recommendations for Outpatient Follow-up:   1. Please repeat CBC/BMET at next visit 2. Patient is started on lovenox injections to bridge with coumadin until INR is above 2. Please check PT/INR on Monday 5/14. If INR above 2, patient can stop Lovenox injections and continue Coumadin 3. Home health RN to do Lovenox injections at the facility. Patient has been also provided Lovenox injection teaching and he is familiar with Lovenox injections as well.   DIET: heart health diet     Allergies:   Allergies  Allergen Reactions  . Oxycodone Other (See Comments)    nightmares  . Benadryl [Diphenhydramine]      DISCHARGE MEDICATIONS: Current Discharge Medication List    START taking these medications   Details  azithromycin (ZITHROMAX) 500 MG tablet Take 1 tablet (500 mg total) by mouth daily. Qty: 5 tablet, Refills: 0    cefUROXime (CEFTIN) 500 MG tablet Take 1 tablet (500 mg total) by mouth 2 (two) times daily with a meal. X 5 more days Qty: 10 tablet, Refills: 0    dextromethorphan-guaiFENesin (MUCINEX DM) 30-600 MG 12hr tablet Take 1 tablet by mouth 2 (two) times daily. Qty: 30 tablet, Refills: 0    enoxaparin (LOVENOX) 120 MG/0.8ML injection Inject 0.8 mLs (120 mg total)  into the skin every 12 (twelve) hours. Continue until INR above 2, then stop Qty: 8 Syringe, Refills: 0    mupirocin ointment (BACTROBAN) 2 % Place 1 application into the nose 2 (two) times daily. For mupirocin (BACTROBAN) Nasal - with cotton tip swab, apply pea sized amount into each nare.  Gently press sides of nose together to spread oint. AVOID contact with eyes. For 2 more days Qty: 22 g, Refills: 0      CONTINUE these medications which have CHANGED   Details  metoprolol tartrate (LOPRESSOR) 25 MG tablet Take 1 tablet (25 mg total) by mouth every 8 (eight) hours. Qty: 90 tablet, Refills: 3    traMADol (ULTRAM) 50 MG tablet Take 1 tablet (50 mg total) by mouth every 6 (six) hours as needed for moderate pain. Qty: 20 tablet, Refills: 0      CONTINUE these medications which have NOT CHANGED   Details  b complex vitamins capsule Take 1 capsule by mouth daily.    cetirizine (ZYRTEC) 10 MG tablet Take 10 mg by mouth daily.    Cholecalciferol (VITAMIN D3 ULTRA STRENGTH) 5000 units capsule Take 5,000 Units by mouth daily.    dicyclomine (BENTYL) 20 MG tablet Take 20 mg by mouth every 6 (six) hours.    docusate sodium (COLACE) 100 MG capsule Take 100 mg by mouth 2 (two) times daily as needed for mild constipation.    furosemide (LASIX) 40 MG tablet Take 40 mg by mouth 2 (two) times daily.    gabapentin (NEURONTIN) 300 MG capsule Take 300mg   nightly for 1 week then increase to 2 tablets nightly. Qty: 21 capsule, Refills: 3    ipratropium-albuterol (DUONEB) 0.5-2.5 (3) MG/3ML SOLN Take 3 mLs by nebulization every 6 (six) hours as needed.    Multiple Vitamins-Minerals (MULTIVITAMIN ADULT) TABS Take 1 tablet by mouth daily.    omeprazole (PRILOSEC) 20 MG capsule Take 20 mg by mouth daily.    OXYGEN Inhale 2 L into the lungs daily as needed (SOB).    polyethylene glycol powder (GLYCOLAX/MIRALAX) powder Mix 17g in 8oz of water twice daily. May titrate as needed. Qty: 255 g, Refills: 3     potassium chloride SA (K-DUR,KLOR-CON) 20 MEQ tablet Take 20 mEq by mouth 2 (two) times daily.    rOPINIRole (REQUIP) 4 MG tablet Take 4 mg by mouth at bedtime.    spironolactone (ALDACTONE) 50 MG tablet Take 50 mg by mouth 2 (two) times daily.    vitamin B-12 (CYANOCOBALAMIN) 500 MCG tablet Take 500 mcg by mouth daily.    warfarin (COUMADIN) 7.5 MG tablet Take 7.5 mg by mouth daily.         Brief H and P: For complete details please refer to admission H and P, but in briefRonald Charles Cintis an 83 y.o.malewith a PMH of chronic diastolic CHF, CAD status post CABG, atrial fibrillation on chronic Coumadin, history of TIA, RLS, GERD, depression, chronic respiratory failure on 2 L of supplemental oxygen at home, history pulmonary embolism who was admitted 09/23/16 with a chief complaint of worsening dyspnea over the course of one month, new onset of cough with clear/thick mucus, and a 2 year history of chronic intermittent abdominal pain that has gotten worse recently. He was found to be hypoxic with an oxygen saturation of 86% on admission and was placed on BiPAP. Chest x-ray showed patchy airspace disease on the left. Admitted to the SDU for further evaluation and treatment.   Hospital Course:  CAP (community acquired pneumonia) complicated by acute on chronic respiratory failure with hypoxia - Patient required BiPAP overnight at the time of admission,Patient was successfully weaned off of the BiPAP. - - He was placed empirically on IV Rocephin and Zithromax. Mucinex and bronchodilators. - Influenza panel negative, urine strep antigen negative, blood cultures negative so far, urine Legionella antigen negative  - Lactic acid is not elevated.  - Chest x-ray showed cardiomegaly with left-sided airspace disease. - PTOT evaluation recommended DME rolling walker, continue ALF - Transitioned to oral Ceftin and Zithromax   Atrial fibrillation with RVR (HCC) - CHA2DS2-VASc Scoreis 6.   - Heart rate was controlled on beta blocker but however had two 6 second pauses on telemetry on 5/7 hence beta blocker was held. Beta blocker was restarted once heart rate was noticed to be elevated in the low 100s, on Lopressor 25 mg every 8 hours  (was on 50 mg 3 times a day prior to admission) - INR subtherapeutic. Lovenox bridging with Coumadin until INR therapeutic, continue Lovenox until INR is above 2. - Cardiology consult was obtained. Patient was seen by Dr. Linde Gillis, recommended continue metoprolol at current dose and monitor heart rate when ambulating. Patient may need pacemaker in the future but prefer to place it as outpatient if he tolerates the metoprolol without further pauses. With possibility of pneumonia would like to avoid pacemaker implant with active infection. - Outpatient follow-up with EP cardiology has been scheduled on 10/11/16 per patient's request on Thursday when he has transportation from his assisted living facility. - 2-D echo showed EF  65-70%, normal wall motion  History of Pulmonary embolism (HCC) - Continue Coumadin. added Lovenox bridging, INR 1.6. Patient is familiar with Lovenox. He was provided with Lovenox teaching by RN.  CAD (coronary artery disease) of artery bypass graft -Troponin 0.03. Continue metoprolol.   chronic Abdominal pain -Currently resolved, possibly from constipation. Lipase WNL. CT of the abdomen and pelvis negative for acute findings other than bladder wall thickening. Pain may be from this, constipation, or referred pain from mild degenerative disc disease or cystic changes in the right ischium/ileum. - Morphine ineffective, continue Toradol as needed and low dose hydrocodone.Patient has had this pain for 2 years. Patient states that he had extensive workup done in [place] about his abdominal pain. - I recommended patient to follow-up with his outpatient PCP to avoid duplicating the same workup. He does not have any abdominal pain  at this time and agrees with the plan.  GERD (gastroesophageal reflux disease) -Continue Protonix.  Depression -Continue Requip.  Chronic diastolic CHF (congestive heart failure) (HCC) - No pulmonary edema noted on chest radiography. Appears to be compensated.  - Continue home dose of Lasix, spironolactone  Obesity, Class II, BMI 35.0-39.9, with comorbidity (see actual BMI) -Body mass index is 36.32 kg/m.  MRSA carrier - Continue Bactroban application  Chronic venous stasis ulcers Wound care nurse removed unna boots, blisters have healed and there are no other open wounds or edema, so unna boots have been discontinued.   Day of Discharge BP 107/69 (BP Location: Right Arm)   Pulse 89   Temp 97.5 F (36.4 C) (Oral)   Resp 18   Ht 6' (1.829 m)   Wt 116.3 kg (256 lb 8 oz)   SpO2 98%   BMI 34.79 kg/m   Physical Exam: General: Alert and oriented 3, NAD HEENT: PERRLA, no JVD CVS: S1 and S2 clear, no murmurs rubs or gallops, irregularly irregular Chest: Clear to auscultation bilaterally Abdomen: Soft, nontender, nondistended, normal bowel sounds Extremities: No cyanosis, clubbing, trace edema bilaterally Neuro: no new focal neurological deficits   The results of significant diagnostics from this hospitalization (including imaging, microbiology, ancillary and laboratory) are listed below for reference.    LAB RESULTS: Basic Metabolic Panel:  Recent Labs Lab 09/25/16 0321 09/26/16 0629  NA 133* 133*  K 4.3 4.0  CL 97* 95*  CO2 27 28  GLUCOSE 95 93  BUN 14 14  CREATININE 0.78 0.80  CALCIUM 8.7* 8.6*   Liver Function Tests:  Recent Labs Lab 09/23/16 1947  AST 24  ALT 11*  ALKPHOS 65  BILITOT 0.5  PROT 7.3  ALBUMIN 3.0*    Recent Labs Lab 09/23/16 1947  LIPASE 31   No results for input(s): AMMONIA in the last 168 hours. CBC:  Recent Labs Lab 09/23/16 1947 09/25/16 0321 09/26/16 0629  WBC 13.7* 13.4* 9.7  NEUTROABS 10.3*   --   --   HGB 14.6 13.9 13.6  HCT 43.7 43.7 41.8  MCV 95.2 96.3 95.7  PLT 233 179 193   Cardiac Enzymes:  Recent Labs Lab 09/23/16 1947  TROPONINI <0.03   BNP: Invalid input(s): POCBNP CBG:  Recent Labs Lab 09/26/16 0941 09/27/16 0901  GLUCAP 144* 90    Significant Diagnostic Studies:  Ct Abdomen Pelvis W Contrast  Result Date: 09/24/2016 CLINICAL DATA:  Acute onset of generalized abdominal pain and shortness of breath. Initial encounter. EXAM: CT ABDOMEN AND PELVIS WITH CONTRAST TECHNIQUE: Multidetector CT imaging of the abdomen and pelvis was performed using the  standard protocol following bolus administration of intravenous contrast. CONTRAST:  ISOVUE-300 IOPAMIDOL (ISOVUE-300) INJECTION 61% COMPARISON:  CT of the abdomen and pelvis from 04/09/2016 FINDINGS: Lower chest: Mild bibasilar atelectasis or scarring is noted. The visualized portions of the mediastinum are grossly unremarkable. Hepatobiliary: Scattered calcified granulomata are noted within the liver. The liver is otherwise unremarkable. The patient is status post cholecystectomy, with clips noted at the gallbladder fossa. The common bile duct remains normal in caliber. Pancreas: The pancreas is within normal limits. Spleen: Scattered calcified granulomata are seen within the spleen. Adrenals/Urinary Tract: The adrenal glands are grossly unremarkable in appearance. Nonspecific perinephric stranding is noted bilaterally. A small right renal cyst is noted. There is no evidence of hydronephrosis. No renal or ureteral stones are identified. Stomach/Bowel: The stomach is unremarkable in appearance. The small bowel is within normal limits. The patient is status post appendectomy. The colon is unremarkable in appearance. Vascular/Lymphatic: Scattered calcification is seen along the abdominal aorta and its branches. The abdominal aorta is otherwise grossly unremarkable. The inferior vena cava is grossly unremarkable. No  retroperitoneal lymphadenopathy is seen. No pelvic sidewall lymphadenopathy is identified. Reproductive: The bladder is decompressed, with a Foley catheter in place. Apparent bladder wall thickening may reflect cystitis. The prostate is normal in size. Other: No additional soft tissue abnormalities are seen. Musculoskeletal: No acute osseous abnormalities are identified. A left hip arthroplasty is grossly unremarkable in appearance, though incompletely imaged. Prominent cystic change is noted at the right ischium and ilium. Multilevel vacuum phenomenon is noted along the lower thoracic and lower lumbar spine. Underlying facet disease is noted. The visualized musculature is unremarkable in appearance. IMPRESSION: 1. No acute abnormality seen to explain the patient's symptoms. 2. Apparent bladder wall thickening may reflect cystitis. 3. Mild bibasilar atelectasis or scarring noted. 4. Small right renal cyst noted. 5. Scattered aortic atherosclerosis. 6. Prominent cystic change at the right ischium and ilium, grossly unchanged from 2017. 7. Mild degenerative change along the lower thoracic and lower lumbar spine. Electronically Signed   By: Roanna Raider M.D.   On: 09/24/2016 00:51   Dg Chest Portable 1 View  Result Date: 09/23/2016 CLINICAL DATA:  SOB and entire abdominal pain. No chest pain. Also reports coughing up phlegm that's been going on for months. EXAM: PORTABLE CHEST 1 VIEW COMPARISON:  09/12/2016 FINDINGS: Mildly degraded exam due to AP portable technique and patient body habitus. Numerous leads and wires project over the chest. Midline trachea. Cardiomegaly accentuated by AP portable technique. Moderate right hemidiaphragm elevation. No pleural effusion or pneumothorax. Extremely low lung volumes, with resultant pulmonary interstitial prominence. Patchy left mid and lower lung pulmonary opacities are similar to the 04/09/2016 exam and likely a present on 09/12/2016. IMPRESSION: Cardiomegaly with  persistent or recurrent patchy left-sided airspace disease. Suspicious for pneumonia. Chronic scarring or even calcified pleural plaques could have a similar appearance. Cardiomegaly with right hemidiaphragm elevation and low lung volumes. No congestive failure. Electronically Signed   By: Jeronimo Greaves M.D.   On: 09/23/2016 20:16    2D ECHO: Study Conclusions  - Left ventricle: The cavity size was normal. There was mild focal   basal hypertrophy of the septum. Systolic function was vigorous.   The estimated ejection fraction was in the range of 65% to 70%.   Wall motion was normal; there were no regional wall motion   abnormalities. - Left atrium: The atrium was moderately dilated. - Right ventricle: The cavity size was moderately dilated. Wall   thickness  was normal. - Right atrium: The atrium was mildly dilated. - Tricuspid valve: There was mild regurgitation. - Pericardium, extracardiac: A trivial pericardial effusion was   identified.  Disposition and Follow-up: Discharge Instructions    Diet - low sodium heart healthy    Complete by:  As directed    Discharge instructions    Complete by:  As directed    Please continue lovenox injections twice a day, check INR on Monday, 10/01/16. If INR is above 2, you can stop lovenox injections and continue coumadin.   Increase activity slowly    Complete by:  As directed        DISPOSITION: ALF   DISCHARGE FOLLOW-UP Follow-up Information    Regan Lemmingamnitz, Will Martin, MD Follow up on 10/11/2016.   Specialty:  Cardiology Why:  11:30AM  Contact information: [address] [place] [address] [phone]            Time spent on Discharge: 25 minutes   Signed:   Thad Rangeripudeep Texanna Hilburn M.D. [facility] 09/28/2016, 10:35 AM Pager: [phone]

## 2016-09-28 NOTE — Progress Notes (Signed)
RN notified by CCMD that pt. Not showing up on tele. Pts. Telemetry box, leads, and battery changed. Skin cleansed and new electrodes placed. Pt. Alert and stable. Will continue to monitor.

## 2016-09-28 NOTE — Progress Notes (Signed)
ANTICOAGULATION CONSULT NOTE - Follow Up Consult  Pharmacy Consult for Coumadin and Lovenox Indication: atrial fibrillation and h/o PE 03/2016  Allergies  Allergen Reactions  . Oxycodone Other (See Comments)    nightmares  . Benadryl [Diphenhydramine]     Patient Measurements: Height: 6' (182.9 cm) Weight: 256 lb 8 oz (116.3 kg) IBW/kg (Calculated) : 77.6  Vital Signs: Temp: 97.5 F (36.4 C) (05/11 0625) Temp Source: Oral (05/11 0625) BP: 107/69 (05/11 0625) Pulse Rate: 89 (05/11 0625)  Labs:  Recent Labs  09/26/16 0629 09/27/16 0352 09/28/16 0546  HGB 13.6  --   --   HCT 41.8  --   --   PLT 193  --   --   LABPROT 17.6* 19.4* 18.6*  INR 1.43 1.62 1.54  CREATININE 0.80  --   --     Estimated Creatinine Clearance: 97 mL/min (by C-G formula based on SCr of 0.8 mg/dL).  Assessment: 82yom on coumadin pta for afib and hx PE. Admit INR 1.08 and lovenox bridge added.  INR = 1.54  *Coumadin dose missed 5/6 as he was in the ED and consult not ordered until early 5/7 AM*  Home dose: 7.5mg  daily - last taken 5/5  Goal of Therapy:  Anti-Xa level 0.6-1.2 (4h after LMWH given) INR 2-3 Monitor platelets by anticoagulation protocol: Yes   Plan:  1) Warfarin 10 mg po x 1 dose tonight 2) Continue lovenox 120mg  sq q12 3) Daily INR, CBC q72 at least  Thank you Okey RegalLisa Ilene Witcher, PharmD [phone]  09/28/2016,8:40 AM

## 2016-09-28 NOTE — Clinical Social Work Note (Signed)
CSW facilitated patient discharge including contacting facility to confirm patient discharge plans. Patient declined to have CSW call family. He stated he would call them when he returned. Clinical information faxed to facility and family agreeable with plan. CSW arranged ambulance transport via [facility] to [facility] around 1:00-1:15 pm. RN to call report prior to discharge [phone]).  CSW will sign off for now as social work intervention is no longer needed. Please consult us again if new needs arise.  Charlynn CourtSarah Deiona Hooper, CSW [phone]

## 2016-09-28 NOTE — [place] FL2 (Signed)
[place] MEDICAID FL2 LEVEL OF CARE SCREENING TOOL     IDENTIFICATION  Patient Name: Joseph Barrera Birthdate: 11/11/1933 Sex: male Admission Date (Current Location): 09/23/2016  [facility] and [place] Number:  Producer, television/film/video and Address:  The . [facility], [address], [place], [place] [address]      Provider Number: [id number]  Attending Physician Name and Address:  Cathren Harsh, MD  Relative Name and Phone Number:       Current Level of Care: Other (Comment) ([facility]) Recommended Level of Care: Assisted Living Facility (with HHPT and [facility]) Prior Approval Number:    Date Approved/Denied:   PASRR Number:    Discharge Plan: Other (Comment) (ALF with HHPT and HHRN)    Current Diagnoses: Patient Active Problem List   Diagnosis Date Noted  . Obesity, Class II, BMI 35.0-39.9, with comorbidity (see actual BMI) 09/24/2016  . MRSA carrier 09/24/2016  . Venous stasis of both lower extremities 09/24/2016  . Sinus pause 09/24/2016  . CAP (community acquired pneumonia) 09/23/2016  . Acute on chronic respiratory failure with hypoxia (HCC) 09/23/2016  . Abdominal pain 09/23/2016  . GERD (gastroesophageal reflux disease) 09/23/2016  . Depression 09/23/2016  . Chronic diastolic CHF (congestive heart failure) (HCC) 09/23/2016  . Atrial fibrillation with RVR (HCC) 04/06/2016  . History of pulmonary embolism 04/06/2016  . Restless legs syndrome (RLS) 04/06/2016  . Sleep hypopnea 04/06/2016  . Constipation 04/06/2016    Orientation RESPIRATION BLADDER Height & Weight     Self, Time, Situation, Place  Normal, O2 (2L prn) Continent Weight: 256 lb 8 oz (116.3 kg) Height:  6' (182.9 cm)  BEHAVIORAL SYMPTOMS/MOOD NEUROLOGICAL BOWEL NUTRITION STATUS      Continent  (HEART HEALTHY)  AMBULATORY STATUS COMMUNICATION OF NEEDS Skin   Limited Assist Verbally Normal                       Personal Care Assistance Level of Assistance   Bathing, Feeding, Dressing Bathing Assistance: Limited assistance Feeding assistance: Independent Dressing Assistance: Limited assistance     Functional Limitations Info  Sight, Hearing, Speech Sight Info: Adequate Hearing Info: Adequate Speech Info: Adequate    SPECIAL CARE FACTORS FREQUENCY  (S) PT (By licensed PT) (hh pt)    3 x week                Contractures Contractures Info: Not present    Additional Factors Info  Code Status, Allergies (full)   Allergies Info: oxycodone, benadryl (oxycodone)           Current Medications (09/28/2016):  This is the current hospital active medication list Current Facility-Administered Medications  Medication Dose Route Frequency Provider Last Rate Last Dose  . acetaminophen (TYLENOL) tablet 650 mg  650 mg Oral Q6H PRN Lorretta Harp, MD      . azithromycin [facility]) tablet 500 mg  500 mg Oral QHS Rai, Ripudeep K, MD   500 mg at 09/27/16 2249  . B-complex with vitamin C tablet 1 tablet  1 tablet Oral Daily Lorretta Harp, MD   1 tablet at 09/27/16 1112  . cefTRIAXone (ROCEPHIN) 1 g in dextrose 5 % 50 mL IVPB  1 g Intravenous Q24H Lorretta Harp, MD   Stopped at 09/28/16 0019  . cholecalciferol (VITAMIN D) tablet 5,000 Units  5,000 Units Oral Daily Lorretta Harp, MD   5,000 Units at 09/27/16 1112  . cyanocobalamin tablet 500 mcg  500 mcg Oral  Daily Lorretta Harp, MD   500 mcg at 09/27/16 1128  . dextromethorphan-guaiFENesin (MUCINEX DM) 30-600 MG per 12 hr tablet 1 tablet  1 tablet Oral BID Lorretta Harp, MD   1 tablet at 09/27/16 2249  . dicyclomine (BENTYL) tablet 20 mg  20 mg Oral Q6H Lorretta Harp, MD   20 mg at 09/28/16 1610  . docusate sodium (COLACE) capsule 100 mg  100 mg Oral BID PRN Lorretta Harp, MD      . enoxaparin (LOVENOX) injection 120 mg  120 mg Subcutaneous Q12H Titus Mould, RPH   120 mg at 09/28/16 9604  . furosemide (LASIX) tablet 40 mg  40 mg Oral BID Lorretta Harp, MD   40 mg at 09/27/16 1703  . gabapentin (NEURONTIN) capsule 600 mg   600 mg Oral QHS Lorretta Harp, MD   600 mg at 09/27/16 2249  . HYDROcodone-acetaminophen (NORCO/VICODIN) 5-325 MG per tablet 1 tablet  1 tablet Oral Q4H PRN Rama, Maryruth Bun, MD      . hydrOXYzine (ATARAX/VISTARIL) tablet 10 mg  10 mg Oral TID PRN Lorretta Harp, MD      . ipratropium (ATROVENT) nebulizer solution 0.5 mg  0.5 mg Nebulization Q6H PRN Rama, Maryruth Bun, MD      . ketorolac (TORADOL) 15 MG/ML injection 15 mg  15 mg Intravenous Q6H PRN Rama, Maryruth Bun, MD      . levalbuterol (XOPENEX) nebulizer solution 1.25 mg  1.25 mg Nebulization Q6H PRN Rama, Maryruth Bun, MD      . loratadine (CLARITIN) tablet 10 mg  10 mg Oral Daily Lorretta Harp, MD   10 mg at 09/27/16 1111  . MEDLINE mouth rinse  15 mL Mouth Rinse BID Rai, Ripudeep K, MD   15 mL at 09/27/16 2250  . metoprolol tartrate (LOPRESSOR) tablet 25 mg  25 mg Oral Q8H Rama, Maryruth Bun, MD   25 mg at 09/28/16 5409  . morphine 4 MG/ML injection 1 mg  1 mg Intravenous Q2H PRN Lorretta Harp, MD   1 mg at 09/24/16 1420  . multivitamin with minerals tablet 1 tablet  1 tablet Oral Daily Lorretta Harp, MD   1 tablet at 09/27/16 1112  . mupirocin ointment (BACTROBAN) 2 % 1 application  1 application Nasal BID Lorretta Harp, MD   1 application at 09/27/16 2250  . pantoprazole (PROTONIX) EC tablet 40 mg  40 mg Oral Daily Lorretta Harp, MD   40 mg at 09/27/16 1111  . polyethylene glycol powder (GLYCOLAX/MIRALAX) container 255 g  1 Container Oral Daily PRN Lorretta Harp, MD      . rOPINIRole (REQUIP) tablet 4 mg  4 mg Oral QHS Lorretta Harp, MD   4 mg at 09/27/16 2250  . spironolactone (ALDACTONE) tablet 50 mg  50 mg Oral BID Lorretta Harp, MD   50 mg at 09/27/16 2249  . traMADol (ULTRAM) tablet 50 mg  50 mg Oral Q6H PRN Lorretta Harp, MD      . warfarin (COUMADIN) tablet 10 mg  10 mg Oral ONCE-1800 Rai, Ripudeep K, MD      . Warfarin - Pharmacist Dosing Inpatient   Does not apply q1800 Emi Holes, RPH      . zolpidem (AMBIEN) tablet 5 mg  5 mg Oral QHS PRN Lorretta Harp, MD          Discharge Medications: Please see discharge summary for a list of discharge medications.  Relevant Imaging Results:  Relevant Lab Results:   Additional Information CONTINUE  these medications which have CHANGED  Margarito LinerSarah C Janiel Derhammer, LCSW   DISCHARGE MEDICATIONS:    Current Discharge Medication List       START taking these medications   Details  azithromycin (ZITHROMAX) 500 MG tablet Take 1 tablet (500 mg total) by mouth daily. Qty: 5 tablet, Refills: 0    cefUROXime (CEFTIN) 500 MG tablet Take 1 tablet (500 mg total) by mouth 2 (two) times daily with a meal. X 5 more days Qty: 10 tablet, Refills: 0    dextromethorphan-guaiFENesin (MUCINEX DM) 30-600 MG 12hr tablet Take 1 tablet by mouth 2 (two) times daily. Qty: 30 tablet, Refills: 0    enoxaparin (LOVENOX) 120 MG/0.8ML injection Inject 0.8 mLs (120 mg total) into the skin every 12 (twelve) hours. Continue until INR above 2, then stop Qty: 8 Syringe, Refills: 0    mupirocin ointment (BACTROBAN) 2 % Place 1 application into the nose 2 (two) times daily. For mupirocin (BACTROBAN) Nasal - with cotton tip swab, apply pea sized amount into each nare.  Gently press sides of nose together to spread oint. AVOID contact with eyes. For 2 more days Qty: 22 g, Refills: 0         CONTINUE these medications which have CHANGED   Details  metoprolol tartrate (LOPRESSOR) 25 MG tablet Take 1 tablet (25 mg total) by mouth every 8 (eight) hours. Qty: 90 tablet, Refills: 3    traMADol (ULTRAM) 50 MG tablet Take 1 tablet (50 mg total) by mouth every 6 (six) hours as needed for moderate pain. Qty: 20 tablet, Refills: 0      CONTINUE these medications which have NOT CHANGED   Details  b complex vitamins capsule Take 1 capsule by mouth daily.    cetirizine (ZYRTEC) 10 MG tablet Take 10 mg by mouth daily.    Cholecalciferol (VITAMIN D3 ULTRA STRENGTH) 5000 units capsule Take 5,000 Units by mouth daily.     dicyclomine (BENTYL) 20 MG tablet Take 20 mg by mouth every 6 (six) hours.    docusate sodium (COLACE) 100 MG capsule Take 100 mg by mouth 2 (two) times daily as needed for mild constipation.    furosemide (LASIX) 40 MG tablet Take 40 mg by mouth 2 (two) times daily.    gabapentin (NEURONTIN) 300 MG capsule Take 300mg  nightly for 1 week then increase to 2 tablets nightly. Qty: 21 capsule, Refills: 3    ipratropium-albuterol (DUONEB) 0.5-2.5 (3) MG/3ML SOLN Take 3 mLs by nebulization every 6 (six) hours as needed.    Multiple Vitamins-Minerals (MULTIVITAMIN ADULT) TABS Take 1 tablet by mouth daily.    omeprazole (PRILOSEC) 20 MG capsule Take 20 mg by mouth daily.    OXYGEN Inhale 2 L into the lungs daily as needed (SOB).    polyethylene glycol powder (GLYCOLAX/MIRALAX) powder Mix 17g in 8oz of water twice daily. May titrate as needed. Qty: 255 g, Refills: 3    potassium chloride SA (K-DUR,KLOR-CON) 20 MEQ tablet Take 20 mEq by mouth 2 (two) times daily.    rOPINIRole (REQUIP) 4 MG tablet Take 4 mg by mouth at bedtime.    spironolactone (ALDACTONE) 50 MG tablet Take 50 mg by mouth 2 (two) times daily.    vitamin B-12 (CYANOCOBALAMIN) 500 MCG tablet Take 500 mcg by mouth daily.    warfarin (COUMADIN) 7.5 MG tablet Take 7.5 mg by mouth daily.

## 2016-10-11 ENCOUNTER — Emergency Department (HOSPITAL_COMMUNITY)
Admission: EM | Admit: 2016-10-11 | Discharge: 2016-10-11 | Disposition: A | Discharge status: Home / Self Care | Payer: Medicare Other | Attending: Emergency Medicine | Admitting: Emergency Medicine

## 2016-10-11 ENCOUNTER — Encounter (HOSPITAL_COMMUNITY): Payer: Self-pay | Admitting: Emergency Medicine

## 2016-10-11 ENCOUNTER — Emergency Department (HOSPITAL_COMMUNITY): Payer: Medicare Other

## 2016-10-11 ENCOUNTER — Ambulatory Visit (INDEPENDENT_AMBULATORY_CARE_PROVIDER_SITE_OTHER): Payer: Medicare Other | Admitting: Physician Assistant

## 2016-10-11 ENCOUNTER — Encounter (INDEPENDENT_AMBULATORY_CARE_PROVIDER_SITE_OTHER): Payer: Self-pay

## 2016-10-11 ENCOUNTER — Ambulatory Visit: Payer: Medicare Other | Admitting: Physician Assistant

## 2016-10-11 VITALS — BP 109/79 | HR 76 | Ht 71.0 in | Wt 261.0 lb

## 2016-10-11 DIAGNOSIS — Z79899 Other long term (current) drug therapy: Secondary | ICD-10-CM | POA: Insufficient documentation

## 2016-10-11 DIAGNOSIS — Z87891 Personal history of nicotine dependence: Secondary | ICD-10-CM | POA: Insufficient documentation

## 2016-10-11 DIAGNOSIS — Z7901 Long term (current) use of anticoagulants: Secondary | ICD-10-CM | POA: Diagnosis not present

## 2016-10-11 DIAGNOSIS — S81811A Laceration without foreign body, right lower leg, initial encounter: Secondary | ICD-10-CM | POA: Insufficient documentation

## 2016-10-11 DIAGNOSIS — Z23 Encounter for immunization: Secondary | ICD-10-CM | POA: Insufficient documentation

## 2016-10-11 DIAGNOSIS — Y9289 Other specified places as the place of occurrence of the external cause: Secondary | ICD-10-CM | POA: Diagnosis not present

## 2016-10-11 DIAGNOSIS — I2510 Atherosclerotic heart disease of native coronary artery without angina pectoris: Secondary | ICD-10-CM | POA: Insufficient documentation

## 2016-10-11 DIAGNOSIS — I110 Hypertensive heart disease with heart failure: Secondary | ICD-10-CM | POA: Diagnosis not present

## 2016-10-11 DIAGNOSIS — Y999 Unspecified external cause status: Secondary | ICD-10-CM | POA: Insufficient documentation

## 2016-10-11 DIAGNOSIS — I482 Chronic atrial fibrillation: Secondary | ICD-10-CM | POA: Diagnosis not present

## 2016-10-11 DIAGNOSIS — I5042 Chronic combined systolic (congestive) and diastolic (congestive) heart failure: Secondary | ICD-10-CM | POA: Diagnosis not present

## 2016-10-11 DIAGNOSIS — Y9389 Activity, other specified: Secondary | ICD-10-CM | POA: Diagnosis not present

## 2016-10-11 DIAGNOSIS — S0990XA Unspecified injury of head, initial encounter: Secondary | ICD-10-CM | POA: Insufficient documentation

## 2016-10-11 DIAGNOSIS — Z8673 Personal history of transient ischemic attack (TIA), and cerebral infarction without residual deficits: Secondary | ICD-10-CM | POA: Insufficient documentation

## 2016-10-11 DIAGNOSIS — I10 Essential (primary) hypertension: Secondary | ICD-10-CM

## 2016-10-11 DIAGNOSIS — W19XXXA Unspecified fall, initial encounter: Secondary | ICD-10-CM

## 2016-10-11 DIAGNOSIS — W07XXXA Fall from chair, initial encounter: Secondary | ICD-10-CM | POA: Insufficient documentation

## 2016-10-11 DIAGNOSIS — I4821 Permanent atrial fibrillation: Secondary | ICD-10-CM

## 2016-10-11 DIAGNOSIS — I509 Heart failure, unspecified: Secondary | ICD-10-CM | POA: Diagnosis not present

## 2016-10-11 MED ORDER — LIDOCAINE-EPINEPHRINE (PF) 2 %-1:200000 IJ SOLN
20.0000 mL | Freq: Once | INTRAMUSCULAR | Status: AC
  Administered 2016-10-11: 20 mL via INTRADERMAL
  Filled 2016-10-11: qty 20

## 2016-10-11 MED ORDER — TETANUS-DIPHTH-ACELL PERTUSSIS 5-2.5-18.5 LF-MCG/0.5 IM SUSP
0.5000 mL | Freq: Once | INTRAMUSCULAR | Status: AC
  Administered 2016-10-11: 0.5 mL via INTRAMUSCULAR
  Filled 2016-10-11: qty 0.5

## 2016-10-11 NOTE — ED Provider Notes (Signed)
LACERATION REPAIR Performed by: Carlyle DollyLAWYER,Gerrell Tabet W Authorized by: Carlyle DollyLAWYER,Sonnie Pawloski W Consent: Verbal consent obtained. Risks and benefits: risks, benefits and alternatives were discussed Consent given by: patient Patient identity confirmed: provided demographic data Prepped and Draped in normal sterile fashion Wound explored  Laceration Location: R lower mid shin  Laceration Length:10 cm  No Foreign Bodies seen or palpated  Anesthesia: local infiltration  Local anesthetic: lidocaine 2% w epinephrine  Anesthetic total: 6 ml  Irrigation method: syringe Amount of cleaning: standard  Skin closure: 4-0 Prolene  Number of sutures: 19   Technique: running sutures and a corner suture  Patient tolerance: Patient tolerated the procedure well with no immediate complications.    Charlestine NightLawyer, Lilliane Sposito, PA-C 10/11/16 [phone]

## 2016-10-11 NOTE — Discharge Instructions (Signed)
Your CT scans, x-rays showed no new injury today. Please follow-up with your primary care provider to have your sutures removed. If you began having severe headache, vomiting, numbness, tingling or weakness on one side of your body, severe back pain, please return to the hospital.

## 2016-10-11 NOTE — Progress Notes (Deleted)
Cardiology Office Note Date:  10/11/2016  Patient ID:  Joseph Barrera, DOB 1934/03/19, MRN [id number] PCP:  Patient, No Pcp Per  Cardiologist:  Dr. Elberta Fortisamnitz   ***refresh   Chief Complaint: post hospital f/u  History of Present Illness: Joseph SeminoleRonald Charles Barrera is a 83 y.o. male who lives at ALF, with history of AFib, TIA x2, DVT/PE, OSA on CPAP, chronic CHF (diastolic), venous stasis,  unclear hx of CAD/MI remotely, HTN, obesity.  [facility] , admitted 09/26/15 with c/o abdominal pain found with pneumonia, while there observed to have 5 second pause, his BB held, with AFib RVR 120s and BB resumed at low dose. With reported HR 100 w/o pauses.  AF rate conrol 90-110 is reasonable and was preferred to hold off on pacing, at least until pneumonia resolved and f/u out patient to discuss further/re-evaluate.  He saw Dr. Elberta Fortisamnitz in Dec, he reported he has had episodes of syncope in the past. He says that when he bends down like he is going to tie his shoes, he gets abdominal pain and a cold feeling over his lower abdomen. When he stands up he has episodes of feeling dizzy like he is going to pass out. He has passed out a few times  *** record reports hx of near syncope? *** any more? *** holter first on lower dose BB? *** meds *** bleeding? Warfarin management    Past Medical History:  Diagnosis Date  . A-fib (HCC)   . Atrial fibrillation (HCC)   . Bronchitis   . CAD (coronary artery disease)   . CHF (congestive heart failure) (HCC)   . Constipation   . Edema   . Gait disturbance   . GERD (gastroesophageal reflux disease)   . Hypertension   . Obesity   . Pulmonary embolism (HCC)   . Restless leg syndrome   . Sleep hypopnea   . TIA (transient ischemic attack)   . Urinary bladder incontinence     Past Surgical History:  Procedure Laterality Date  . ADENOIDECTOMY    . APPENDECTOMY    . CHOLECYSTECTOMY    . HIP SURGERY Left   . KNEE SURGERY Bilateral   . REPLACEMENT TOTAL  KNEE BILATERAL    . TONSILLECTOMY    . TOTAL HIP ARTHROPLASTY Left     Current Outpatient Prescriptions  Medication Sig Dispense Refill  . b complex vitamins capsule Take 1 capsule by mouth daily.    . cefUROXime (CEFTIN) 500 MG tablet Take 1 tablet (500 mg total) by mouth 2 (two) times daily with a meal. X 5 more days 10 tablet 0  . cetirizine (ZYRTEC) 10 MG tablet Take 10 mg by mouth daily.    . Cholecalciferol (VITAMIN D3 ULTRA STRENGTH) 5000 units capsule Take 5,000 Units by mouth daily.    Marland Kitchen. dextromethorphan-guaiFENesin (MUCINEX DM) 30-600 MG 12hr tablet Take 1 tablet by mouth 2 (two) times daily. 30 tablet 0  . dicyclomine (BENTYL) 20 MG tablet Take 20 mg by mouth every 6 (six) hours.    . docusate sodium (COLACE) 100 MG capsule Take 100 mg by mouth 2 (two) times daily as needed for mild constipation.    . enoxaparin (LOVENOX) 120 MG/0.8ML injection Inject 0.8 mLs (120 mg total) into the skin every 12 (twelve) hours. Continue until INR above 2, then stop 8 Syringe 0  . furosemide (LASIX) 40 MG tablet Take 40 mg by mouth 2 (two) times daily.    Marland Kitchen. gabapentin (NEURONTIN) 300 MG  capsule Take 300mg  nightly for 1 week then increase to 2 tablets nightly. (Patient taking differently: Take 600 mg by mouth at bedtime. Take 300mg  nightly for 1 week then increase to 2 tablets nightly.) 21 capsule 3  . ipratropium-albuterol (DUONEB) 0.5-2.5 (3) MG/3ML SOLN Take 3 mLs by nebulization every 6 (six) hours as needed.    . metoprolol tartrate (LOPRESSOR) 25 MG tablet Take 1 tablet (25 mg total) by mouth every 8 (eight) hours. 90 tablet 3  . Multiple Vitamins-Minerals (MULTIVITAMIN ADULT) TABS Take 1 tablet by mouth daily.    . mupirocin ointment (BACTROBAN) 2 % Place 1 application into the nose 2 (two) times daily. For mupirocin (BACTROBAN) Nasal - with cotton tip swab, apply pea sized amount into each nare.  Gently press sides of nose together to spread oint. AVOID contact with eyes. For 2 more days 22 g 0   . omeprazole (PRILOSEC) 20 MG capsule Take 20 mg by mouth daily.    . OXYGEN Inhale 2 L into the lungs daily as needed (SOB).    . polyethylene glycol powder (GLYCOLAX/MIRALAX) powder Mix 17g in 8oz of water twice daily. May titrate as needed. 255 g 3  . potassium chloride SA (K-DUR,KLOR-CON) 20 MEQ tablet Take 20 mEq by mouth 2 (two) times daily.    Marland Kitchen rOPINIRole (REQUIP) 4 MG tablet Take 4 mg by mouth at bedtime.    Marland Kitchen spironolactone (ALDACTONE) 50 MG tablet Take 50 mg by mouth 2 (two) times daily.    . traMADol (ULTRAM) 50 MG tablet Take 1 tablet (50 mg total) by mouth every 6 (six) hours as needed for moderate pain. 20 tablet 0  . vitamin B-12 (CYANOCOBALAMIN) 500 MCG tablet Take 500 mcg by mouth daily.    Marland Kitchen warfarin (COUMADIN) 7.5 MG tablet Take 7.5 mg by mouth daily.     No current facility-administered medications for this visit.     Allergies:   Oxycodone and Benadryl [diphenhydramine]   Social History:  The patient  reports that he quit smoking about 55 years ago. His smoking use included Cigarettes. He has never used smokeless tobacco. He reports that he does not drink alcohol or use drugs.   Family History:  The patient's family history includes CVA in his father; Lead poisoning in his brother.  ROS:  Please see the history of present illness.   All other systems are reviewed and otherwise negative.   PHYSICAL EXAM: *** VS:  There were no vitals taken for this visit. BMI: There is no height or weight on file to calculate BMI. Well nourished, well developed, in no acute distress  HEENT: normocephalic, atraumatic  Neck: no JVD, carotid bruits or masses Cardiac:  *** RRR; no significant murmurs, no rubs, or gallops Lungs:  *** CTA b/l, no wheezing, rhonchi or rales  Abd: soft, nontender MS: no deformity or atrophy Ext: *** no edema  Skin: warm and dry, no rash Neuro:  No gross deficits appreciated Psych: euthymic mood, full affect   EKG:  Done today shows ***  09/27/16:  TTE Study Conclusions - Left ventricle: The cavity size was normal. There was mild focal   basal hypertrophy of the septum. Systolic function was vigorous.   The estimated ejection fraction was in the range of 65% to 70%.   Wall motion was normal; there were no regional wall motion   abnormalities. - Left atrium: The atrium was moderately dilated. - Right ventricle: The cavity size was moderately dilated. Wall   thickness  was normal. - Right atrium: The atrium was mildly dilated. - Tricuspid valve: There was mild regurgitation. - Pericardium, extracardiac: A trivial pericardial effusion was   identified   Recent Labs: 09/23/2016: ALT 11; B Natriuretic Peptide 88.4 09/26/2016: BUN 14; Creatinine, Ser 0.80; Hemoglobin 13.6; Platelets 193; Potassium 4.0; Sodium 133; TSH 1.249  No results found for requested labs within last 8760 hours.   Estimated Creatinine Clearance: 97 mL/min (by C-G formula based on SCr of 0.8 mg/dL).   Wt Readings from Last 3 Encounters:  09/28/16 256 lb 8 oz (116.3 kg)  09/12/16 246 lb (111.6 kg)  05/08/16 267 lb (121.1 kg)     Other studies reviewed: Additional studies/records reviewed today include: summarized above  ASSESSMENT AND PLAN:  1. AFib (suspect is permanent)     [id number] is at least 5, on warfarin  2. ? Tachy-brady     ***  3. Chronic CHF (diastolic)     ***     Component of venous stasis  4. CAD    ***      Negative stress test Nov 2017   Disposition: F/u with ***  Current medicines are reviewed at length with the patient today.  The patient did not have any concerns regarding medicines.***  Signed, Sherrilee Gilles, PA-C 10/11/2016 5:21 AM     [facility] [address] [place] [address] [phone] (office)  [phone] (fax)

## 2016-10-11 NOTE — ED Triage Notes (Signed)
[facility], reported to have fallen asleep in hjs electric wheelchair fell hitting head and causing lack to right calf. Pt denies pain but is taking blood thinners

## 2016-10-11 NOTE — Progress Notes (Signed)
Cardiology Office Note Date:  10/11/2016  Patient ID:  Joseph Barrera, DOB Mar 20, 1934, MRN [id number] PCP:  Patient, No Pcp Per  Cardiologist:  Dr. Elberta Fortis      Chief Complaint: post hospital f/u  History of Present Illness: Joseph Barrera is a 83 y.o. male who lives at ALF, with history of AFib, TIA x2, DVT/PE, OSA on CPAP, chronic CHF (diastolic), venous stasis,  unclear hx of CAD/MI remotely, HTN, obesity.  He was in [facility] , admitted 09/26/15 with c/o abdominal pain found with pneumonia, while there observed to have 5 second pause, his BB held, with AFib RVR 120s and BB resumed at low dose. With reported HR 100 w/o pauses.  AF rate conrol 90-110 is reasonable and was preferred to hold off on pacing, at least until pneumonia resolved and f/u out patient to discuss further/re-evaluate.  He saw Dr. Elberta Fortis in Dec, he reported he has had episodes of syncope in the past. He says that when he bends down like he is going to tie his shoes, he gets abdominal pain and a cold feeling over his lower abdomen. When he stands up he has episodes of feeling dizzy like he is going to pass out. He has passed out a few times  The patient is living now at the [facility] since his discharge a few weeks ago, he says likely permanently, he is able to ambulate with a walker but when out uses a wheelchair.  He denies any dizziness, near syncope or syncope since his discharge after his pneumonia.  He was in the ER this morning after a fall.  He tells me is certain he did not faint.  He was up at his computer, which he does routinely until very early in the morning, says he will try to stay up for as long as he can in order to be able to sleep well, otherwise tosses and turns and gets no sleep.  He says he felt himself getting very sleepy having a hard time staying awake, but was only getting to about 2AM, and was trying to stay up longer.  He recalls waking as he was falling out of his chair stating he would  not have fallen, but the arm rest was up and he was not able to stop the fall, it took him a while to get to where he could call for help, had scraped his leg on the side of the desk and required stitches.  We spoke about this at length and he tells me he knows what it feels like to faint having fainted a number of times historically (last was Nov 2017), and this time had drifted to sleep, and aware once he started to fall this AM.  He denies any kind of CP or palpitations, no SOB.  He reports he is chronically swollen and his current edema is the best it has been in years.  Outside of this ijury, he denies any bleeding or signs of bleeding.    Past Medical History:  Diagnosis Date  . A-fib (HCC)   . Atrial fibrillation (HCC)   . Bronchitis   . CAD (coronary artery disease)   . CHF (congestive heart failure) (HCC)   . Constipation   . Edema   . Gait disturbance   . GERD (gastroesophageal reflux disease)   . Hypertension   . Obesity   . Pulmonary embolism (HCC)   . Restless leg syndrome   . Sleep hypopnea   .  TIA (transient ischemic attack)   . Urinary bladder incontinence     Past Surgical History:  Procedure Laterality Date  . ADENOIDECTOMY    . APPENDECTOMY    . CHOLECYSTECTOMY    . HIP SURGERY Left   . KNEE SURGERY Bilateral   . REPLACEMENT TOTAL KNEE BILATERAL    . TONSILLECTOMY    . TOTAL HIP ARTHROPLASTY Left     Current Outpatient Prescriptions  Medication Sig Dispense Refill  . b complex vitamins capsule Take 1 capsule by mouth daily.    . cetirizine (ZYRTEC) 10 MG tablet Take 10 mg by mouth daily.    . Cholecalciferol (VITAMIN D3 ULTRA STRENGTH) 5000 units capsule Take 5,000 Units by mouth daily.    Marland Kitchen dextromethorphan-guaiFENesin (MUCINEX DM) 30-600 MG 12hr tablet Take 1 tablet by mouth 2 (two) times daily. 30 tablet 0  . dicyclomine (BENTYL) 20 MG tablet Take 20 mg by mouth every 6 (six) hours.    . docusate sodium (COLACE) 100 MG capsule Take 100 mg by mouth 2  (two) times daily as needed for mild constipation.    . furosemide (LASIX) 40 MG tablet Take 40 mg by mouth 2 (two) times daily.    Marland Kitchen gabapentin (NEURONTIN) 300 MG capsule Take 300mg  nightly for 1 week then increase to 2 tablets nightly. (Patient taking differently: Take 600 mg by mouth at bedtime. Take 300mg  nightly for 1 week then increase to 2 tablets nightly.) 21 capsule 3  . ipratropium-albuterol (DUONEB) 0.5-2.5 (3) MG/3ML SOLN Take 3 mLs by nebulization every 6 (six) hours as needed (shortness of breath).     . metoprolol tartrate (LOPRESSOR) 25 MG tablet Take 1 tablet (25 mg total) by mouth every 8 (eight) hours. 90 tablet 3  . Multiple Vitamins-Minerals (MULTIVITAMIN ADULT) TABS Take 1 tablet by mouth daily.    Marland Kitchen omeprazole (PRILOSEC) 20 MG capsule Take 20 mg by mouth daily.    . OXYGEN Inhale 2 L into the lungs daily as needed (SOB).    . polyethylene glycol powder (GLYCOLAX/MIRALAX) powder Mix 17g in 8oz of water twice daily. May titrate as needed. 255 g 3  . potassium chloride SA (K-DUR,KLOR-CON) 20 MEQ tablet Take 20 mEq by mouth 2 (two) times daily.    Marland Kitchen rOPINIRole (REQUIP) 4 MG tablet Take 4 mg by mouth at bedtime.    Marland Kitchen spironolactone (ALDACTONE) 50 MG tablet Take 50 mg by mouth 2 (two) times daily.    . traMADol (ULTRAM) 50 MG tablet Take 1 tablet (50 mg total) by mouth every 6 (six) hours as needed for moderate pain. 20 tablet 0  . vitamin B-12 (CYANOCOBALAMIN) 500 MCG tablet Take 500 mcg by mouth daily.    Marland Kitchen warfarin (COUMADIN) 7.5 MG tablet Take 7.5 mg by mouth daily.     No current facility-administered medications for this visit.     Allergies:   Oxycodone and Benadryl [diphenhydramine]   Social History:  The patient  reports that he quit smoking about 55 years ago. His smoking use included Cigarettes. He has never used smokeless tobacco. He reports that he does not drink alcohol or use drugs.   Family History:  The patient's family history includes CVA in his father; Lead  poisoning in his brother.  ROS:  Please see the history of present illness.   All other systems are reviewed and otherwise negative.   PHYSICAL EXAM:  VS:  BP 109/79   Pulse 76   Ht 5\' 11"  (1.803 m)  Wt 261 lb (118.4 kg)   BMI 36.40 kg/m  BMI: Body mass index is 36.4 kg/m. Well nourished, well developed, in no acute distress  HEENT: normocephalic, atraumatic  Neck: no JVD, carotid bruits or masses Cardiac:  IRRR; no significant murmurs, no rubs, or gallops Lungs:  CTA b/l, no wheezing, rhonchi or rales  Abd: soft, nontender, obese MS: no deformity or atrophy Ext: RLE is dressed, + LLE 1++ edema, mild erythematous changes, also reported as chronic, superficial skin abrasions lateral aspect Skin: warm and dry, no rash Neuro:  No gross deficits appreciated Psych: euthymic mood, full affect   EKG:  Done today at the ER shows AF 109bpm, QRS 105ms  09/27/16: TTE Study Conclusions - Left ventricle: The cavity size was normal. There was mild focal   basal hypertrophy of the septum. Systolic function was vigorous.   The estimated ejection fraction was in the range of 65% to 70%.   Wall motion was normal; there were no regional wall motion   abnormalities. - Left atrium: The atrium was moderately dilated. - Right ventricle: The cavity size was moderately dilated. Wall   thickness was normal. - Right atrium: The atrium was mildly dilated. - Tricuspid valve: There was mild regurgitation. - Pericardium, extracardiac: A trivial pericardial effusion was   identified   Recent Labs: 09/23/2016: ALT 11; B Natriuretic Peptide 88.4 09/26/2016: BUN 14; Creatinine, Ser 0.80; Hemoglobin 13.6; Platelets 193; Potassium 4.0; Sodium 133; TSH 1.249  No results found for requested labs within last 8760 hours.   Estimated Creatinine Clearance: 96.4 mL/min (by C-G formula based on SCr of 0.8 mg/dL).   Wt Readings from Last 3 Encounters:  10/11/16 261 lb (118.4 kg)  09/28/16 256 lb 8 oz (116.3 kg)    09/12/16 246 lb (111.6 kg)     Other studies reviewed: Additional studies/records reviewed today include: summarized above  ASSESSMENT AND PLAN:  1. AFib (suspect is permanent)     CHA2DD2Vasc is at least 5, on warfarin, monitored and managed at the ALF  2. ? Tachy-brady     Again, he is certain he drifted to sleep this AM, did not faint and has not had any brady symptoms since the hospital     He is on 1/2 his prior dose of BB     He would like to avoid PPM if possible, will plan Em and f/u w/Dr. Elberta Fortisamnitz in 6 weeks, sooner if needed  3. Chronic CHF (diastolic)     Component of venous stasis     Edema better then his baseline by the patient's report  4. CAD, ?     No c/o CP        Negative stress test Nov 2017  5. HTN     Looks good, no changes   Disposition: F/u with as above  Current medicines are reviewed at length with the patient today.  The patient did not have any concerns regarding medicines.  Judith BlonderSigned, Alvaretta Eisenberger Ursy, PA-C 10/11/2016 12:00 PM     [facility] [address] [place] [address] [phone] (office)  [phone] (fax)

## 2016-10-11 NOTE — Patient Instructions (Addendum)
Medication Instructions:   Your physician recommends that you continue on your current medications as directed. Please refer to the Current Medication list given to you today.   If you need a refill on your cardiac medications before your next appointment, please call your pharmacy.  Labwork: NONE ORDERED  TODAY     Testing/Procedures: Your physician has recommended that you wear an event monitor. Event monitors are medical devices that record the heart's electrical activity. Doctors most often us these monitors to diagnose arrhythmias. Arrhythmias are problems with the speed or rhythm of the heartbeat. The monitor is a small, portable device. You can wear one while you do your normal daily activities. This is usually used to diagnose what is causing palpitations/syncope (passing out).       Follow-Up:  IN 6 WEEKS WITH CAMNITZ    Any Other Special Instructions Will Be Listed Below (If Applicable).

## 2016-10-11 NOTE — ED Provider Notes (Addendum)
TIME SEEN: 5:39 AM  CHIEF COMPLAINT: Fall  HPI: Patient is an 83 year old male with history of atrial fibrillation on Coumadin, hypertension, TIA who lives at a nursing facility who presents emergency department after a fall. Patient reports that he was sitting at his desk in his electric wheelchair when he woke up on the floor. States he thinks he must have slipped out of the wheelchair. Reports he did hit his head but did not lose consciousness. Reports that his left hand got caught in the wheelchair and he is having left hand pain. Has a laceration to the right tibia. No numbness, tingling or focal weakness. No preceding symptoms that led him to fall. States he's otherwise been feeling well.  ROS: See HPI Constitutional: no fever  Eyes: no drainage  ENT: no runny nose   Cardiovascular:  no chest pain  Resp: no SOB or cough GI: no vomiting or diarrhea GU: no dysuria Integumentary: no rash  Allergy: no hives  Musculoskeletal: no leg swelling  Neurological: no slurred speech ROS otherwise negative  PAST MEDICAL HISTORY/PAST SURGICAL HISTORY:  Past Medical History:  Diagnosis Date  . A-fib (HCC)   . Atrial fibrillation (HCC)   . Bronchitis   . CAD (coronary artery disease)   . CHF (congestive heart failure) (HCC)   . Constipation   . Edema   . Gait disturbance   . GERD (gastroesophageal reflux disease)   . Hypertension   . Obesity   . Pulmonary embolism (HCC)   . Restless leg syndrome   . Sleep hypopnea   . TIA (transient ischemic attack)   . Urinary bladder incontinence     MEDICATIONS:  Prior to Admission medications   Medication Sig Start Date End Date Taking? Authorizing Provider  b complex vitamins capsule Take 1 capsule by mouth daily.    [provider]  cefUROXime (CEFTIN) 500 MG tablet Take 1 tablet (500 mg total) by mouth 2 (two) times daily with a meal. X 5 more days 09/27/16   Rai, Ripudeep K, MD  cetirizine (ZYRTEC) 10 MG tablet Take 10 mg by mouth  daily.    [provider]  Cholecalciferol (VITAMIN D3 ULTRA STRENGTH) 5000 units capsule Take 5,000 Units by mouth daily.    [provider]  dextromethorphan-guaiFENesin (MUCINEX DM) 30-600 MG 12hr tablet Take 1 tablet by mouth 2 (two) times daily. 09/27/16   Rai, Delene Ruffini, MD  dicyclomine (BENTYL) 20 MG tablet Take 20 mg by mouth every 6 (six) hours.    [provider]  docusate sodium (COLACE) 100 MG capsule Take 100 mg by mouth 2 (two) times daily as needed for mild constipation.    [provider]  enoxaparin (LOVENOX) 120 MG/0.8ML injection Inject 0.8 mLs (120 mg total) into the skin every 12 (twelve) hours. Continue until INR above 2, then stop 09/27/16   Rai, Ripudeep K, MD  furosemide (LASIX) 40 MG tablet Take 40 mg by mouth 2 (two) times daily.    [provider]  gabapentin (NEURONTIN) 300 MG capsule Take 300mg  nightly for 1 week then increase to 2 tablets nightly. Patient taking differently: Take 600 mg by mouth at bedtime. Take 300mg  nightly for 1 week then increase to 2 tablets nightly. 05/03/16   Armbruster, Reeves Forth, MD  ipratropium-albuterol (DUONEB) 0.5-2.5 (3) MG/3ML SOLN Take 3 mLs by nebulization every 6 (six) hours as needed.    [provider]  metoprolol tartrate (LOPRESSOR) 25 MG tablet Take 1 tablet (25 mg total)  by mouth every 8 (eight) hours. 09/27/16   Rai, Delene Ruffini, MD  Multiple Vitamins-Minerals (MULTIVITAMIN ADULT) TABS Take 1 tablet by mouth daily.    [provider]  mupirocin ointment (BACTROBAN) 2 % Place 1 application into the nose 2 (two) times daily. For mupirocin (BACTROBAN) Nasal - with cotton tip swab, apply pea sized amount into each nare.  Gently press sides of nose together to spread oint. AVOID contact with eyes. For 2 more days 09/27/16   Rai, Delene Ruffini, MD  omeprazole (PRILOSEC) 20 MG capsule Take 20 mg by mouth daily.    [provider]  OXYGEN Inhale 2 L into the lungs daily  as needed (SOB).    [provider]  polyethylene glycol powder (GLYCOLAX/MIRALAX) powder Mix 17g in 8oz of water twice daily. May titrate as needed. 09/20/16   Armbruster, Reeves Forth, MD  potassium chloride SA (K-DUR,KLOR-CON) 20 MEQ tablet Take 20 mEq by mouth 2 (two) times daily.    [provider]  rOPINIRole (REQUIP) 4 MG tablet Take 4 mg by mouth at bedtime.    [provider]  spironolactone (ALDACTONE) 50 MG tablet Take 50 mg by mouth 2 (two) times daily.    [provider]  traMADol (ULTRAM) 50 MG tablet Take 1 tablet (50 mg total) by mouth every 6 (six) hours as needed for moderate pain. 09/27/16   Rai, Delene Ruffini, MD  vitamin B-12 (CYANOCOBALAMIN) 500 MCG tablet Take 500 mcg by mouth daily.    [provider]  warfarin (COUMADIN) 7.5 MG tablet Take 7.5 mg by mouth daily.    [provider]    ALLERGIES:  Allergies  Allergen Reactions  . Oxycodone Other (See Comments)    nightmares  . Benadryl [Diphenhydramine]     SOCIAL HISTORY:  Social History  Substance Use Topics  . Smoking status: Former Smoker    Types: Cigarettes    Quit date: 1963  . Smokeless tobacco: Never Used  . Alcohol use No     Comment: Stopped in 2010    FAMILY HISTORY: Family History  Problem Relation Age of Onset  . CVA Father   . Lead poisoning Brother     EXAM: BP 105/69   Pulse (!) 105   Temp 97.9 F (36.6 C) (Oral)   Resp 14   SpO2 92%  CONSTITUTIONAL: Alert and oriented 3 and responds appropriately to questions. Well-appearing; well-nourished; GCS 15, elderly, in no distress HEAD: Normocephalic; atraumatic EYES: Conjunctivae clear, PERRL, EOMI ENT: normal nose; no rhinorrhea; moist mucous membranes; pharynx without lesions noted; no dental injury; no septal hematoma NECK: Supple, no meningismus, no LAD; patient does have mild midline cervical spine tenderness but no step-off or deformity; trachea midline CARD: Mildly tachycardic,  irregularly irregular; S1 and S2 appreciated; no murmurs, no clicks, no rubs, no gallops RESP: Normal chest excursion without splinting or tachypnea; breath sounds clear and equal bilaterally; no wheezes, no rhonchi, no rales; no hypoxia or respiratory distress CHEST:  chest wall stable, no crepitus or ecchymosis or deformity, nontender to palpation; no flail chest ABD/GI: Normal bowel sounds; non-distended; soft, non-tender, no rebound, no guarding; no ecchymosis or other lesions noted PELVIS:  stable, nontender to palpation BACK:  The back appears normal and is mildly tender over the upper thoracic spine, there is no CVA tenderness; no midline spinal step-off or deformity EXT: Patient tender to palpation throughout the dorsal left hand but no bony deformity, swelling or ecchymosis. 2+ left radial pulse. 2+  DP pulses bilaterally. Patient has a V-shaped laceration that is superficial and involves the mid anterior shin of the right leg is approximately 10 cm. Currently hemostatic. Normal ROM in all joints; otherwise extremities are non-tender to palpation; patient has mild bilateral lower extremity edema and chronic mild erythema and warmth of his shins bilaterally which she reports is chronic and unchanged; normal capillary refill; no cyanosis, no other bony tenderness or bony deformity of patient's extremities, no joint effusion, compartments are soft, extremities are warm and well-perfused, no ecchymosis SKIN: Normal color for age and race; warm NEURO: Moves all extremities equally, sensation to light touch intact diffusely, cranial nerves II through XII intact, no aphasia PSYCH: The patient's mood and manner are appropriate. Grooming and personal hygiene are appropriate.  MEDICAL DECISION MAKING: Patient here after fall out of his wheelchair. Has a large laceration to his right shin that will need repair. Will update his tetanus vaccination. Will obtain CT imaging of his head, cervical spine and x-rays  of his thoracic spine, left hand, right tibia/fibula. Denies preceding symptoms that led to his fall. Reports he is otherwise been feeling well. He is oriented 3, neurologically intact. Denies any pain medication at this time.  ED PROGRESS: Patient's x-rays, CT scans show no acute abnormality. X-ray of thoracic spine shows a new possible superior endplate compression fracture of the mid thoracic level. He does not have any tenderness at this level. All of his thoracic pain was in the T1, T2 distribution. X-ray shows chronic appearing fractures at these levels. Nothing acute. Will repair his laceration and discharge back to nursing facility. Patient still hemodynamically stable, neurologically intact without any complaints. Still declining pain medication.  Please see Joseph Ridgehris Lawyer, PA note for laceration repair.  At this time, I do not feel there is any life-threatening condition present. I have reviewed and discussed all results (EKG, imaging, lab, urine as appropriate) and exam findings with patient/family. I have reviewed nursing notes and appropriate previous records.  I feel the patient is safe to be discharged home without further emergent workup and can continue workup as an outpatient as needed. Discussed usual and customary return precautions. Patient/family verbalize understanding and are comfortable with this plan.  Outpatient follow-up has been provided if needed. All questions have been answered.    EKG Interpretation  Date/Time:  Thursday Oct 11 2016 05:27:58 EDT Ventricular Rate:  109 PR Interval:    QRS Duration: 105 QT Interval:  334 QTC Calculation: 411 R Axis:   -26 Text Interpretation:  Atrial fibrillation Ventricular bigeminy Borderline left axis deviation Low voltage, precordial leads Baseline wander in lead(s) V3 V6 No significant change since last tracing Confirmed by WARD,  DO, KRISTEN [phone]([id number]) on 10/11/2016 5:52:34 AM         Ward, Layla MawKristen N, DO 10/11/16 0715     Ward, Layla MawKristen N, DO 10/11/16 [id number]

## 2016-10-24 ENCOUNTER — Ambulatory Visit: Payer: Medicare Other | Admitting: Cardiology

## 2016-10-30 ENCOUNTER — Ambulatory Visit: Payer: Medicare Other | Admitting: Cardiology

## 2016-11-01 ENCOUNTER — Telehealth: Payer: Self-pay | Admitting: *Deleted

## 2016-11-01 ENCOUNTER — Ambulatory Visit (INDEPENDENT_AMBULATORY_CARE_PROVIDER_SITE_OTHER): Payer: Medicare Other

## 2016-11-01 DIAGNOSIS — I4821 Permanent atrial fibrillation: Secondary | ICD-10-CM

## 2016-11-01 DIAGNOSIS — I482 Chronic atrial fibrillation: Secondary | ICD-10-CM

## 2016-11-01 MED ORDER — METOPROLOL TARTRATE 50 MG PO TABS: 50.0000 mg | ORAL_TABLET | Freq: Two times a day (BID) | ORAL | 1 refills | Status: DC

## 2016-11-01 NOTE — Telephone Encounter (Signed)
[employer] faxed automatic baseline transmission to Dr Elberta Fortisamnitz and RN for further review.  Pt received his event monitor today at 1030 at our office, for further investigation of afib.  As mentioned below, is Renee Ursuy's PA-C assessment and plan, as to why she ordered for this pt to have an event monitor placed.   ASSESSMENT AND PLAN:  1. AFib (suspect is permanent)     CHA2DD2Vasc is at least 5, on warfarin, monitored and managed at the ALF  2. ? Tachy-brady     Again, he is certain he drifted to sleep this AM, did not faint and has not had any brady symptoms since the hospital     He is on 1/2 his prior dose of BB     He would like to avoid PPM if possible, will plan Em and f/u w/Dr. Elberta Fortisamnitz in 6 weeks, sooner if needed  3. Chronic CHF (diastolic)     Component of venous stasis     Edema better then his baseline by the patient's report  4. CAD, ?     No c/o CP        Negative stress test Nov 2017  5. HTN     Looks good, no changes   Noted on baseline monitor from this morning, the pt is in atrial fibrillation with a HR of 130 bpm.  In Dr. Elberta Fortisamnitz absence, this recording was showed to our DOD Dr. Anne FuSkains for further review and recommendation.  Per Dr Anne FuSkains, we should increase his metoprolol tartrate to 50 mg po bid, continue to monitor, and forward this note to Dr Elberta Fortisamnitz and Francis Dowseenee Ursuy.   Spoke with the pts SNF ([facility]) and endorsed to the hall RN.  Informed Blondie RN,  that per Dr Anne FuSkains, based on this pts baseline recording, verbal order given to increase the pts metoprolol to 50 mg po bid.  [facility], this order will need to be faxed to their facility, and attentioned to her at [phone].  Informed Blondie that I will have this faxed now.  Will forward this message to Dr Elberta Fortisamnitz, RN, and Francis Dowseenee Ursuy PA-C.

## 2016-11-20 ENCOUNTER — Ambulatory Visit: Payer: Medicare Other | Admitting: Cardiology

## 2016-11-20 ENCOUNTER — Encounter: Payer: Self-pay | Admitting: *Deleted

## 2016-11-20 NOTE — Progress Notes (Signed)
Patient ID: Geri SeminoleRonald Charles Harn, male   DOB: 1933/12/22, 83 y.o.   MRN: [id number]  Patient returned [employer]/ [employer] monitor to our office on 11/20/2016, stating, he was done wearing it.  [facility] packaged [facility], 11/20/2016, 12:30 PM.

## 2016-11-26 ENCOUNTER — Encounter (HOSPITAL_COMMUNITY): Payer: Self-pay | Admitting: Emergency Medicine

## 2016-11-26 ENCOUNTER — Emergency Department (HOSPITAL_COMMUNITY): Payer: Medicare Other

## 2016-11-26 ENCOUNTER — Inpatient Hospital Stay (HOSPITAL_COMMUNITY)
Admission: EM | Admit: 2016-11-26 | Discharge: 2016-11-28 | DRG: 194 | Disposition: A | Discharge status: Home Health | Payer: Medicare Other | Attending: Family Medicine | Admitting: Family Medicine

## 2016-11-26 DIAGNOSIS — Z9181 History of falling: Secondary | ICD-10-CM | POA: Diagnosis not present

## 2016-11-26 DIAGNOSIS — Z7901 Long term (current) use of anticoagulants: Secondary | ICD-10-CM | POA: Diagnosis not present

## 2016-11-26 DIAGNOSIS — Z87891 Personal history of nicotine dependence: Secondary | ICD-10-CM | POA: Diagnosis not present

## 2016-11-26 DIAGNOSIS — Z6836 Body mass index (BMI) 36.0-36.9, adult: Secondary | ICD-10-CM

## 2016-11-26 DIAGNOSIS — I482 Chronic atrial fibrillation, unspecified: Secondary | ICD-10-CM | POA: Diagnosis present

## 2016-11-26 DIAGNOSIS — S0990XA Unspecified injury of head, initial encounter: Secondary | ICD-10-CM

## 2016-11-26 DIAGNOSIS — K219 Gastro-esophageal reflux disease without esophagitis: Secondary | ICD-10-CM | POA: Diagnosis present

## 2016-11-26 DIAGNOSIS — L89152 Pressure ulcer of sacral region, stage 2: Secondary | ICD-10-CM | POA: Diagnosis present

## 2016-11-26 DIAGNOSIS — Z8701 Personal history of pneumonia (recurrent): Secondary | ICD-10-CM

## 2016-11-26 DIAGNOSIS — R55 Syncope and collapse: Secondary | ICD-10-CM | POA: Diagnosis present

## 2016-11-26 DIAGNOSIS — Z22322 Carrier or suspected carrier of Methicillin resistant Staphylococcus aureus: Secondary | ICD-10-CM | POA: Diagnosis not present

## 2016-11-26 DIAGNOSIS — Z8673 Personal history of transient ischemic attack (TIA), and cerebral infarction without residual deficits: Secondary | ICD-10-CM | POA: Diagnosis not present

## 2016-11-26 DIAGNOSIS — R000 Tachycardia, unspecified: Secondary | ICD-10-CM | POA: Diagnosis present

## 2016-11-26 DIAGNOSIS — Z9981 Dependence on supplemental oxygen: Secondary | ICD-10-CM | POA: Diagnosis not present

## 2016-11-26 DIAGNOSIS — I5032 Chronic diastolic (congestive) heart failure: Secondary | ICD-10-CM | POA: Diagnosis present

## 2016-11-26 DIAGNOSIS — Z86711 Personal history of pulmonary embolism: Secondary | ICD-10-CM | POA: Diagnosis not present

## 2016-11-26 DIAGNOSIS — G2581 Restless legs syndrome: Secondary | ICD-10-CM | POA: Diagnosis present

## 2016-11-26 DIAGNOSIS — J181 Lobar pneumonia, unspecified organism: Secondary | ICD-10-CM | POA: Diagnosis not present

## 2016-11-26 DIAGNOSIS — Z96653 Presence of artificial knee joint, bilateral: Secondary | ICD-10-CM | POA: Diagnosis present

## 2016-11-26 DIAGNOSIS — J189 Pneumonia, unspecified organism: Principal | ICD-10-CM

## 2016-11-26 DIAGNOSIS — L8990 Pressure ulcer of unspecified site, unspecified stage: Secondary | ICD-10-CM | POA: Insufficient documentation

## 2016-11-26 DIAGNOSIS — Y95 Nosocomial condition: Secondary | ICD-10-CM | POA: Diagnosis present

## 2016-11-26 DIAGNOSIS — J9611 Chronic respiratory failure with hypoxia: Secondary | ICD-10-CM | POA: Diagnosis not present

## 2016-11-26 DIAGNOSIS — Z96642 Presence of left artificial hip joint: Secondary | ICD-10-CM | POA: Diagnosis present

## 2016-11-26 DIAGNOSIS — J440 Chronic obstructive pulmonary disease with acute lower respiratory infection: Secondary | ICD-10-CM | POA: Diagnosis present

## 2016-11-26 DIAGNOSIS — Z823 Family history of stroke: Secondary | ICD-10-CM

## 2016-11-26 DIAGNOSIS — I110 Hypertensive heart disease with heart failure: Secondary | ICD-10-CM | POA: Diagnosis present

## 2016-11-26 DIAGNOSIS — E669 Obesity, unspecified: Secondary | ICD-10-CM | POA: Diagnosis present

## 2016-11-26 DIAGNOSIS — I2510 Atherosclerotic heart disease of native coronary artery without angina pectoris: Secondary | ICD-10-CM | POA: Diagnosis present

## 2016-11-26 HISTORY — DX: Pneumonia, unspecified organism: J18.9

## 2016-11-26 HISTORY — DX: Unspecified urinary incontinence: R32

## 2016-11-26 LAB — COMPREHENSIVE METABOLIC PANEL
ALK PHOS: 63 U/L (ref 38–126)
ALT: 15 U/L — ABNORMAL LOW (ref 17–63)
AST: 26 U/L (ref 15–41)
Albumin: 3.1 g/dL — ABNORMAL LOW (ref 3.5–5.0)
Anion gap: 8 (ref 5–15)
BUN: 27 mg/dL — AB (ref 6–20)
CALCIUM: 9.1 mg/dL (ref 8.9–10.3)
CO2: 35 mmol/L — ABNORMAL HIGH (ref 22–32)
CREATININE: 1 mg/dL (ref 0.61–1.24)
Chloride: 91 mmol/L — ABNORMAL LOW (ref 101–111)
Glucose, Bld: 112 mg/dL — ABNORMAL HIGH (ref 65–99)
Potassium: 4.6 mmol/L (ref 3.5–5.1)
Sodium: 134 mmol/L — ABNORMAL LOW (ref 135–145)
Total Bilirubin: 0.5 mg/dL (ref 0.3–1.2)
Total Protein: 7.4 g/dL (ref 6.5–8.1)

## 2016-11-26 LAB — CBG MONITORING, ED: GLUCOSE-CAPILLARY: 114 mg/dL — AB (ref 65–99)

## 2016-11-26 LAB — MRSA PCR SCREENING: MRSA BY PCR: POSITIVE — AB

## 2016-11-26 LAB — CBC WITH DIFFERENTIAL/PLATELET
BASOS ABS: 0 10*3/uL (ref 0.0–0.1)
Basophils Relative: 0 %
EOS ABS: 0.1 10*3/uL (ref 0.0–0.7)
EOS PCT: 1 %
HCT: 45.7 % (ref 39.0–52.0)
Hemoglobin: 14.9 g/dL (ref 13.0–17.0)
LYMPHS ABS: 1.7 10*3/uL (ref 0.7–4.0)
Lymphocytes Relative: 14 %
MCH: 31.4 pg (ref 26.0–34.0)
MCHC: 32.6 g/dL (ref 30.0–36.0)
MCV: 96.4 fL (ref 78.0–100.0)
MONO ABS: 0.8 10*3/uL (ref 0.1–1.0)
Monocytes Relative: 6 %
Neutro Abs: 9.4 10*3/uL — ABNORMAL HIGH (ref 1.7–7.7)
Neutrophils Relative %: 79 %
PLATELETS: 265 10*3/uL (ref 150–400)
RBC: 4.74 MIL/uL (ref 4.22–5.81)
RDW: 13.6 % (ref 11.5–15.5)
WBC: 11.9 10*3/uL — AB (ref 4.0–10.5)

## 2016-11-26 LAB — CK: CK TOTAL: 80 U/L (ref 49–397)

## 2016-11-26 LAB — URINALYSIS, ROUTINE W REFLEX MICROSCOPIC
Bilirubin Urine: NEGATIVE
Glucose, UA: NEGATIVE mg/dL
Hgb urine dipstick: NEGATIVE
Ketones, ur: NEGATIVE mg/dL
Leukocytes, UA: NEGATIVE
Nitrite: NEGATIVE
Protein, ur: NEGATIVE mg/dL
Specific Gravity, Urine: 1.011 (ref 1.005–1.030)
pH: 6 (ref 5.0–8.0)

## 2016-11-26 LAB — BRAIN NATRIURETIC PEPTIDE: B NATRIURETIC PEPTIDE 5: 59.9 pg/mL (ref 0.0–100.0)

## 2016-11-26 LAB — PROCALCITONIN: Procalcitonin: <0.1 ng/mL

## 2016-11-26 LAB — TROPONIN I: Troponin I: <0.03 ng/mL (ref <0.03)

## 2016-11-26 LAB — PROTIME-INR
INR: 3.06
Prothrombin Time: 32.3 s — ABNORMAL HIGH (ref 11.4–15.2)

## 2016-11-26 LAB — I-STAT CG4 LACTIC ACID, ED: Lactic Acid, Venous: 1.87 mmol/L (ref 0.5–1.9)

## 2016-11-26 LAB — STREP PNEUMONIAE URINARY ANTIGEN: Strep Pneumo Urinary Antigen: NEGATIVE

## 2016-11-26 MED ORDER — SODIUM CHLORIDE 0.9% FLUSH: 3.0000 mL | INTRAVENOUS | Status: DC | PRN

## 2016-11-26 MED ORDER — DEXTROSE 5 % IV SOLN
1.0000 g | Freq: Three times a day (TID) | INTRAVENOUS | Status: DC
  Administered 2016-11-26 – 2016-11-28 (×8): 1 g via INTRAVENOUS
  Filled 2016-11-26 (×8): qty 1

## 2016-11-26 MED ORDER — PANTOPRAZOLE SODIUM 40 MG PO TBEC
40.0000 mg | DELAYED_RELEASE_TABLET | Freq: Every day | ORAL | Status: DC
  Administered 2016-11-26 – 2016-11-28 (×3): 40 mg via ORAL
  Filled 2016-11-26 (×3): qty 1

## 2016-11-26 MED ORDER — METOPROLOL TARTRATE 5 MG/5ML IV SOLN: 5.0000 mg | Freq: Once | INTRAVENOUS | Status: DC | PRN

## 2016-11-26 MED ORDER — GABAPENTIN 300 MG PO CAPS
600.0000 mg | ORAL_CAPSULE | Freq: Every day | ORAL | Status: DC
  Administered 2016-11-26 – 2016-11-27 (×2): 600 mg via ORAL
  Filled 2016-11-26 (×2): qty 2

## 2016-11-26 MED ORDER — TRAMADOL HCL 50 MG PO TABS
50.0000 mg | ORAL_TABLET | Freq: Four times a day (QID) | ORAL | Status: DC | PRN
  Administered 2016-11-27 – 2016-11-28 (×3): 50 mg via ORAL
  Filled 2016-11-26 (×3): qty 1

## 2016-11-26 MED ORDER — CEFTRIAXONE SODIUM 1 G IJ SOLR
1.0000 g | Freq: Once | INTRAMUSCULAR | Status: DC
  Filled 2016-11-26: qty 10

## 2016-11-26 MED ORDER — ONDANSETRON HCL 4 MG/2ML IJ SOLN
4.0000 mg | Freq: Four times a day (QID) | INTRAMUSCULAR | Status: DC | PRN
Start: 2016-11-26 — End: 2016-11-28

## 2016-11-26 MED ORDER — B COMPLEX-C PO TABS
1.0000 | ORAL_TABLET | Freq: Every day | ORAL | Status: DC
  Administered 2016-11-26 – 2016-11-28 (×3): 1 via ORAL
  Filled 2016-11-26 (×3): qty 1

## 2016-11-26 MED ORDER — ROPINIROLE HCL 1 MG PO TABS
4.0000 mg | ORAL_TABLET | Freq: Every day | ORAL | Status: DC
  Administered 2016-11-26: 4 mg via ORAL
  Filled 2016-11-26 (×3): qty 4

## 2016-11-26 MED ORDER — MUPIROCIN 2 % EX OINT
1.0000 "application " | TOPICAL_OINTMENT | Freq: Two times a day (BID) | CUTANEOUS | Status: DC
  Administered 2016-11-26 – 2016-11-28 (×3): 1 via NASAL
  Filled 2016-11-26 (×3): qty 22

## 2016-11-26 MED ORDER — SODIUM CHLORIDE 0.9 % IV SOLN: 250.0000 mL | INTRAVENOUS | Status: DC | PRN

## 2016-11-26 MED ORDER — DM-GUAIFENESIN ER 30-600 MG PO TB12
1.0000 | ORAL_TABLET | Freq: Two times a day (BID) | ORAL | Status: DC
  Administered 2016-11-26 – 2016-11-28 (×5): 1 via ORAL
  Filled 2016-11-26 (×5): qty 1

## 2016-11-26 MED ORDER — METOPROLOL TARTRATE 25 MG PO TABS
50.0000 mg | ORAL_TABLET | Freq: Two times a day (BID) | ORAL | Status: DC
  Administered 2016-11-26 – 2016-11-27 (×4): 50 mg via ORAL
  Filled 2016-11-26 (×5): qty 2

## 2016-11-26 MED ORDER — VITAMIN D 1000 UNITS PO TABS
5000.0000 [IU] | ORAL_TABLET | Freq: Every day | ORAL | Status: DC
  Administered 2016-11-26 – 2016-11-28 (×3): 5000 [IU] via ORAL
  Filled 2016-11-26 (×3): qty 5

## 2016-11-26 MED ORDER — VITAMIN B-12 1000 MCG PO TABS
500.0000 ug | ORAL_TABLET | Freq: Every day | ORAL | Status: DC
  Administered 2016-11-26 – 2016-11-28 (×3): 500 ug via ORAL
  Filled 2016-11-26 (×4): qty 1

## 2016-11-26 MED ORDER — VANCOMYCIN HCL 10 G IV SOLR
2000.0000 mg | Freq: Once | INTRAVENOUS | Status: AC
  Administered 2016-11-26: 2000 mg via INTRAVENOUS
  Filled 2016-11-26: qty 2000

## 2016-11-26 MED ORDER — ADULT MULTIVITAMIN W/MINERALS CH
1.0000 | ORAL_TABLET | Freq: Every day | ORAL | Status: DC
  Administered 2016-11-26 – 2016-11-28 (×3): 1 via ORAL
  Filled 2016-11-26 (×4): qty 1

## 2016-11-26 MED ORDER — DICYCLOMINE HCL 20 MG PO TABS
20.0000 mg | ORAL_TABLET | Freq: Four times a day (QID) | ORAL | Status: DC
  Administered 2016-11-26 – 2016-11-28 (×10): 20 mg via ORAL
  Filled 2016-11-26 (×10): qty 1

## 2016-11-26 MED ORDER — LORATADINE 10 MG PO TABS
10.0000 mg | ORAL_TABLET | Freq: Every day | ORAL | Status: DC
  Administered 2016-11-26 – 2016-11-28 (×3): 10 mg via ORAL
  Filled 2016-11-26 (×3): qty 1

## 2016-11-26 MED ORDER — IPRATROPIUM-ALBUTEROL 0.5-2.5 (3) MG/3ML IN SOLN: 3.0000 mL | RESPIRATORY_TRACT | Status: DC | PRN

## 2016-11-26 MED ORDER — ACETAMINOPHEN 325 MG PO TABS: 650.0000 mg | ORAL_TABLET | Freq: Four times a day (QID) | ORAL | Status: DC | PRN

## 2016-11-26 MED ORDER — SODIUM CHLORIDE 0.9% FLUSH
3.0000 mL | Freq: Two times a day (BID) | INTRAVENOUS | Status: DC
  Administered 2016-11-26 – 2016-11-28 (×2): 3 mL via INTRAVENOUS

## 2016-11-26 MED ORDER — SODIUM CHLORIDE 0.9 % IV BOLUS (SEPSIS): 1000.0000 mL | Freq: Once | INTRAVENOUS | Status: DC

## 2016-11-26 MED ORDER — VANCOMYCIN HCL 10 G IV SOLR
1500.0000 mg | INTRAVENOUS | Status: DC
  Administered 2016-11-27 – 2016-11-28 (×2): 1500 mg via INTRAVENOUS
  Filled 2016-11-26 (×2): qty 1500

## 2016-11-26 MED ORDER — CHLORHEXIDINE GLUCONATE CLOTH 2 % EX PADS
6.0000 | MEDICATED_PAD | Freq: Every day | CUTANEOUS | Status: DC
  Administered 2016-11-26 – 2016-11-28 (×3): 6 via TOPICAL

## 2016-11-26 MED ORDER — DOCUSATE SODIUM 100 MG PO CAPS
100.0000 mg | ORAL_CAPSULE | Freq: Two times a day (BID) | ORAL | Status: DC | PRN
  Administered 2016-11-28 (×2): 100 mg via ORAL
  Filled 2016-11-26 (×2): qty 1

## 2016-11-26 MED ORDER — DEXTROSE 5 % IV SOLN
500.0000 mg | Freq: Once | INTRAVENOUS | Status: DC
  Filled 2016-11-26: qty 500

## 2016-11-26 MED ORDER — WARFARIN - PHARMACIST DOSING INPATIENT: Freq: Every day | Status: DC

## 2016-11-26 NOTE — Care Management Note (Signed)
Case Management Note  Patient Details  Name: Joseph Barrera MRN: [id number] Date of Birth: 11/22/1933  Subjective/Objective:  Pneumonia           Action/Plan[facility]; Maralyn SagoSarah SW following for DCP  Expected Discharge Date:    possibly 11/29/2016              Expected Discharge Plan:  Assisted Living Facility In-House Referral:  Clinical Social Work  Discharge planning Services  CM Consult  Status of Service:  In process, will continue to follow  Reola MosherChandler, Velna Hedgecock L, RN,MHA,BSN [phone] 11/26/2016, 10:38 AM

## 2016-11-26 NOTE — Clinical Social Work Note (Signed)
Clinical Social Work Assessment  Patient Details  Name: Joseph Barrera MRN: [id number] Date of Birth: 1933/11/16  Date of referral:  11/26/16               Reason for consult:  Discharge Planning                Permission sought to share information with:  Chartered certified accountant granted to share information::  Yes, Verbal Permission Granted  Name::        Agency::  [facility]  Relationship::     Contact Information:     Housing/Transportation Living arrangements for the past 2 months:  [place] of Information:  Patient, Medical Team Patient Interpreter Needed:  None Criminal Activity/Legal Involvement Pertinent to Current Situation/Hospitalization:  No - Comment as needed Significant Relationships:  Adult Children Lives with:  Facility Resident Do you feel safe going back to the place where you live?  Yes Need for family participation in patient care:  Yes (Comment)  Care giving concerns:  Patient is a resident at [facility].   Social Worker assessment / plan:  CSW met with patient. No supports at bedside. CSW introduced role and explained that discharge planning would be discussed. Patient confirmed that he is from [facility] and plans to return once stable for discharge. No further concerns. CSW encouraged patient to contact CSW as needed. CSW will continue to follow patient for support and facilitate discharge back to ALF once medically stable.  Employment status:  Retired Forensic scientist:  Medicare PT Recommendations:  Not assessed at this time Information / Referral to community resources:     Patient/Family's Response to care:  Patient agreeable to return to ALF. Patient's daughter supportive and involved in patient's care. Patient appreciated social work intervention.  Patient/Family's Understanding of and Emotional Response to Diagnosis, Current Treatment, and Prognosis:  Patient has a good  understanding of the reason for admission and his need to return to ALF once discharged. Patient is happy with hospital care.  Emotional Assessment Appearance:  Appears stated age Attitude/Demeanor/Rapport:  Other (Pleasant) Affect (typically observed):  Accepting, Appropriate, Calm, Pleasant Orientation:  Oriented to Self, Oriented to Place, Oriented to  Time, Oriented to Situation Alcohol / Substance use:  Never Used Psych involvement (Current and /or in the community):  No (Comment)  Discharge Needs  Concerns to be addressed:  Care Coordination Readmission within the last 30 days:  No Current discharge risk:  Dependent with Mobility Barriers to Discharge:  Continued Medical Work up   Candie Chroman, LCSW 11/26/2016, 11:36 AM

## 2016-11-26 NOTE — Progress Notes (Signed)
ANTICOAGULATION CONSULT NOTE - Initial Consult  Pharmacy Consult for Coumadin Indication: atrial fibrillation  Allergies  Allergen Reactions  . Oxycodone Other (See Comments)    nightmares  . Benadryl [Diphenhydramine] Other (See Comments)    unknown    Patient Measurements: Height: 5\' 11"  (180.3 cm) Weight: 261 lb 2 oz (118.4 kg) IBW/kg (Calculated) : 75.3  Vital Signs: Temp: 98 F (36.7 C) (07/09 0238) Temp Source: Rectal (07/09 0238) BP: 102/73 (07/09 0400) Pulse Rate: 115 (07/09 0400)  Labs:  Recent Labs  11/26/16 0054 11/26/16 0121  HGB 14.9  --   HCT 45.7  --   PLT 265  --   LABPROT 32.3*  --   INR 3.06  --   CREATININE 1.00  --   CKTOTAL  --  80  TROPONINI  --  <0.03    Estimated Creatinine Clearance: 77.1 mL/min (by C-G formula based on SCr of 1 mg/dL).   Medical History: Past Medical History:  Diagnosis Date  . A-fib (HCC)   . Atrial fibrillation (HCC)   . Bronchitis   . CAD (coronary artery disease)   . CHF (congestive heart failure) (HCC)   . Constipation   . Edema   . Gait disturbance   . GERD (gastroesophageal reflux disease)   . Hypertension   . Obesity   . Pulmonary embolism (HCC)   . Restless leg syndrome   . Sleep hypopnea   . TIA (transient ischemic attack)   . Urinary bladder incontinence     Medications:  No current facility-administered medications on file prior to encounter.    Current Outpatient Prescriptions on File Prior to Encounter  Medication Sig Dispense Refill  . b complex vitamins capsule Take 1 capsule by mouth daily.    . cetirizine (ZYRTEC) 10 MG tablet Take 10 mg by mouth daily.    . Cholecalciferol (VITAMIN D3 ULTRA STRENGTH) 5000 units capsule Take 5,000 Units by mouth daily.    Marland Kitchen dextromethorphan-guaiFENesin (MUCINEX DM) 30-600 MG 12hr tablet Take 1 tablet by mouth 2 (two) times daily. 30 tablet 0  . dicyclomine (BENTYL) 20 MG tablet Take 20 mg by mouth every 6 (six) hours.    . docusate sodium (COLACE)  100 MG capsule Take 100 mg by mouth 2 (two) times daily as needed for mild constipation.    . furosemide (LASIX) 40 MG tablet Take 40 mg by mouth 2 (two) times daily.    Marland Kitchen gabapentin (NEURONTIN) 300 MG capsule Take 300mg  nightly for 1 week then increase to 2 tablets nightly. (Patient taking differently: Take 600 mg by mouth at bedtime. Take 300mg  nightly for 1 week then increase to 2 tablets nightly.) 21 capsule 3  . ipratropium-albuterol (DUONEB) 0.5-2.5 (3) MG/3ML SOLN Take 3 mLs by nebulization every 6 (six) hours as needed (shortness of breath).     . metoprolol tartrate (LOPRESSOR) 50 MG tablet Take 1 tablet (50 mg total) by mouth 2 (two) times daily. 180 tablet 1  . Multiple Vitamins-Minerals (MULTIVITAMIN ADULT) TABS Take 1 tablet by mouth daily.    Marland Kitchen omeprazole (PRILOSEC) 20 MG capsule Take 20 mg by mouth daily.    . OXYGEN Inhale 2 L into the lungs daily as needed (SOB).    . polyethylene glycol powder (GLYCOLAX/MIRALAX) powder Mix 17g in 8oz of water twice daily. May titrate as needed. 255 g 3  . potassium chloride SA (K-DUR,KLOR-CON) 20 MEQ tablet Take 20 mEq by mouth 2 (two) times daily.    Marland Kitchen rOPINIRole (REQUIP)  4 MG tablet Take 4 mg by mouth at bedtime.    Marland Kitchen. spironolactone (ALDACTONE) 50 MG tablet Take 50 mg by mouth 2 (two) times daily.    . traMADol (ULTRAM) 50 MG tablet Take 1 tablet (50 mg total) by mouth every 6 (six) hours as needed for moderate pain. 20 tablet 0  . vitamin B-12 (CYANOCOBALAMIN) 500 MCG tablet Take 500 mcg by mouth daily.    Marland Kitchen. warfarin (COUMADIN) 7.5 MG tablet Take 7.5 mg by mouth daily.       Assessment: 83 y.o. male admitted with PNA, h/o Afib, to continue Coumadin  Goal of Therapy:  INR 2-3 Monitor platelets by anticoagulation protocol: Yes   Plan:  No Coumadin today F/U daily INR  Aliena Ghrist, Gary FleetGregory Vernon 11/26/2016,4:42 AM

## 2016-11-26 NOTE — ED Notes (Signed)
Ambulated pt with 2 staff assist, pt used walker and portable O2 tank was used. Pt ambulated with steady gait, denied SOB or dizziness/lightheadedness. Pt O2 sats were 90% on 2L when placed back in bed, HR 110's. EDP aware.

## 2016-11-26 NOTE — ED Provider Notes (Signed)
TIME SEEN: 12:56 AM  By signing my name below, I, Cynda AcresHailei Fulton, attest that this documentation has been prepared under the direction and in the presence of Shadia Larose, Layla MawKristen N, DO. Electronically Signed: Cynda AcresHailei Fulton, Scribe. 11/26/16. 1:02 AM.   CHIEF COMPLAINT: Fall  HPI:  Joseph Barrera is a 83 y.o. male with a history of atrial fibrillation on coumadin, CAD, CHF, GERD, PE, and TIA, who presents to the Emergency Department complaining of a sudden-onset fall that occurred earlier tonight. Patient states he fell asleep in a chair after dinner, he was noted to have fell out of the chair earlier tonight. [facility] a [facility], where he did have a witnessed fall. Patient states he has regular syncopal episodes, which is not a new problem. He initially states that he fell out of the chair because he was asleep and then later states that he thinks he may have passed out. States that he thinks he was on the floor for 5 hours before someone found him. He states he has had syncopal events for over 2 years. Patient was recently diagnosed with pneumonia and treated with antibiotics. It appears per his MAR he completed azithromycin 09/23/2016. Patient reports an associated cough that he has had for 7 months which is unchanged. He does were oxygen chronically. No medications given while en route. Patient denies any fever, chills, chest pain or chest discomfort, shortness of breath, abdominal pain, vomiting or diarrhea, numbness, tingling or focal weakness or any additional symptoms.   ROS: See HPI; history is limited as patient is a poor historian Constitutional: no fever  Eyes: no drainage  ENT: no runny nose   Cardiovascular:  no chest pain  Resp: no SOB  GI: no vomiting GU: no dysuria Integumentary: no rash  Allergy: no hives  Musculoskeletal: no leg swelling  Neurological: no slurred speech ROS otherwise negative  PAST MEDICAL HISTORY/PAST SURGICAL HISTORY:  Past Medical  History:  Diagnosis Date  . A-fib (HCC)   . Atrial fibrillation (HCC)   . Bronchitis   . CAD (coronary artery disease)   . CHF (congestive heart failure) (HCC)   . Constipation   . Edema   . Gait disturbance   . GERD (gastroesophageal reflux disease)   . Hypertension   . Obesity   . Pulmonary embolism (HCC)   . Restless leg syndrome   . Sleep hypopnea   . TIA (transient ischemic attack)   . Urinary bladder incontinence     MEDICATIONS:  Prior to Admission medications   Medication Sig Start Date End Date Taking? Authorizing Provider  b complex vitamins capsule Take 1 capsule by mouth daily.    [provider]  cetirizine (ZYRTEC) 10 MG tablet Take 10 mg by mouth daily.    [provider]  Cholecalciferol (VITAMIN D3 ULTRA STRENGTH) 5000 units capsule Take 5,000 Units by mouth daily.    [provider]  dextromethorphan-guaiFENesin (MUCINEX DM) 30-600 MG 12hr tablet Take 1 tablet by mouth 2 (two) times daily. 09/27/16   Rai, Delene Ruffiniipudeep K, MD  dicyclomine (BENTYL) 20 MG tablet Take 20 mg by mouth every 6 (six) hours.    [provider]  docusate sodium (COLACE) 100 MG capsule Take 100 mg by mouth 2 (two) times daily as needed for mild constipation.    [provider]  furosemide (LASIX) 40 MG tablet Take 40 mg by mouth 2 (two) times daily.    [provider]  gabapentin (NEURONTIN) 300 MG capsule  Take 300mg  nightly for 1 week then increase to 2 tablets nightly. Patient taking differently: Take 600 mg by mouth at bedtime. Take 300mg  nightly for 1 week then increase to 2 tablets nightly. 05/03/16   Armbruster, Reeves Forth, MD  ipratropium-albuterol (DUONEB) 0.5-2.5 (3) MG/3ML SOLN Take 3 mLs by nebulization every 6 (six) hours as needed (shortness of breath).     [provider]  metoprolol tartrate (LOPRESSOR) 50 MG tablet Take 1 tablet (50 mg total) by mouth 2 (two) times daily. 11/01/16 01/30/17  Jake Bathe, MD  Multiple  Vitamins-Minerals (MULTIVITAMIN ADULT) TABS Take 1 tablet by mouth daily.    [provider]  omeprazole (PRILOSEC) 20 MG capsule Take 20 mg by mouth daily.    [provider]  OXYGEN Inhale 2 L into the lungs daily as needed (SOB).    [provider]  polyethylene glycol powder (GLYCOLAX/MIRALAX) powder Mix 17g in 8oz of water twice daily. May titrate as needed. 09/20/16   Armbruster, Reeves Forth, MD  potassium chloride SA (K-DUR,KLOR-CON) 20 MEQ tablet Take 20 mEq by mouth 2 (two) times daily.    [provider]  rOPINIRole (REQUIP) 4 MG tablet Take 4 mg by mouth at bedtime.    [provider]  spironolactone (ALDACTONE) 50 MG tablet Take 50 mg by mouth 2 (two) times daily.    [provider]  traMADol (ULTRAM) 50 MG tablet Take 1 tablet (50 mg total) by mouth every 6 (six) hours as needed for moderate pain. 09/27/16   Rai, Delene Ruffini, MD  vitamin B-12 (CYANOCOBALAMIN) 500 MCG tablet Take 500 mcg by mouth daily.    [provider]  warfarin (COUMADIN) 7.5 MG tablet Take 7.5 mg by mouth daily.    [provider]    ALLERGIES:  Allergies  Allergen Reactions  . Oxycodone Other (See Comments)    nightmares  . Benadryl [Diphenhydramine] Other (See Comments)    unknown    SOCIAL HISTORY:  Social History  Substance Use Topics  . Smoking status: Former Smoker    Types: Cigarettes    Quit date: 1963  . Smokeless tobacco: Never Used  . Alcohol use No     Comment: Stopped in 2010    FAMILY HISTORY: Family History  Problem Relation Age of Onset  . CVA Father   . Lead poisoning Brother     EXAM: BP 102/61 (BP Location: Right Arm)   Pulse 91   Temp 97.8 F (36.6 C) (Oral)   Resp (!) 22   Ht 5\' 11"  (1.803 m)   Wt 261 lb 2 oz (118.4 kg)   SpO2 95%   BMI 36.42 kg/m  CONSTITUTIONAL: Alert and oriented x 4; he's elderly and chronically ill appearing; GCS 15 HEAD: Normocephalic; atraumatic EYES: Conjunctivae clear,  PERRL, EOMI ENT: normal nose; no rhinorrhea; moist mucous membranes; pharynx without lesions noted; no dental injury; no septal hematoma NECK: Supple, no meningismus, no LAD; no midline spinal tenderness, step-off or deformity; trachea midline CARD: Irregularly irregular; S1 and S2 appreciated; no murmurs, no clicks, no rubs, no gallops RESP: Normal chest excursion without splinting or tachypnea; wet coughOn examination;  breath sounds equal bilaterally; no wheezes, Rhonchi or sounds heard diffusely; no rales; no hypoxia or respiratory distress, wears oxygen chronically CHEST:  chest wall stable, no crepitus or ecchymosis or deformity, nontender to palpation; no flail chest ABD/GI: Normal bowel sounds; non-distended; soft, non-tender, no rebound, no guarding; no ecchymosis or other lesions noted PELVIS:  stable, nontender to palpation BACK:  The back appears normal and is non-tender to palpation, there is no CVA tenderness; no midline spinal tenderness, step-off or deformity EXT: Normal ROM in all joints; non-tender to palpation; mild nonpitting edema in the bilateral lower extremities with erythema in the distal extremities, no warmth; normal capillary refill; no cyanosis, no bony tenderness or bony deformity of patient's extremities, no joint effusion, compartments are soft, extremities are warm and well-perfused, no ecchymosis SKIN: Normal color for age and race; warm NEURO: Moves all extremities equally, normal speech, cranial nerves II through contact, sensation to light touch intact diffusely PSYCH: The patient's mood and manner are appropriate. Grooming and personal hygiene are appropriate.    MEDICAL DECISION MAKING: Patient here with fall versus syncopal event. Denies any current complaints. Neurologically intact. No chest pain or shortness of breath. He states he feels like he fell asleep while sitting at the table and then fell onto the floor.  Patient is in atrial fibrillation which is  his baseline.  He is on Coumadin. He is unsure if he hit his head. We'll obtain CT of the head and cervical spine. No other sign of trauma on exam. He is currently neurologically intact, hemodynamically stable. He reports he has had 2 years of syncopal events. He does have slightly soft blood pressure here which she states is his baseline and on review of his previous vital signs this does appear to be correct. He states he does not feel dizzy. No vomiting or diarrhea. Nursing staff initially concerned for sepsis. I do not feel that this is sepsis but patient could have pneumonia. Will hydrate patient, obtaining rectal temperature, labs, urine, chest x-ray.  ED PROGRESS: Patient's labs are unremarkable other than a mild leukocytosis with left shift. Normal lactate. INR therapeutic. Troponin negative. BNP normal. Rectal temperature is normal. Chest x-ray shows possible left lower lobe pneumonia. CT of the head and cervical spine show no acute injury. Patient was able to ambulate it did not feel dizzy or lightheaded but did appear winded after walking and sats are 90% when he sat back in bed on his normal 2 L. Given his cough, increased shortness of breath with ambulation, leukocytosis, I suspect he truly does have pneumonia. We'll cover with azithromycin and ceftriaxone. Given his possible syncopal event with history of atrial fibrillation, CHF, CAD, will admit for IV antibody, further monitoring. Will hydrate patient although I do feel his blood pressure is chronic for him.  3:57 AM Discussed patient's case with hospitalist, Dr. Antionette Char.  I have recommended admission and patient (and family if present) agree with this plan. Admitting physician will place admission orders.   I reviewed all nursing notes, vitals, pertinent previous records, EKGs, lab and urine results, imaging (as available).     EKG Interpretation  Date/Time:  Monday November 26 2016 00:55:21 EDT Ventricular Rate:  110 PR Interval:    QRS  Duration: 81 QT Interval:  330 QTC Calculation: 447 R Axis:   -25 Text Interpretation:  Atrial fibrillation Borderline left axis deviation No significant change since last tracing Confirmed by Rochele Raring [phone]) on 11/26/2016 1:19:40 AM         I personally performed the services described in this documentation, which was scribed in my presence. The recorded information has been reviewed and is accurate.     Yuri Flener, Layla Maw, DO 11/26/16 [phone]

## 2016-11-26 NOTE — ED Notes (Signed)
Pt presents from a nursing facility for a fall. Pt denies any injuries however his lung sounds are advantageous. Pt reports being diagnosed with pneumonia a month ago and having treatment for same.

## 2016-11-26 NOTE — Progress Notes (Signed)
Pharmacy Antibiotic Note  Joseph Barrera is a 83 y.o. male admitted on 11/26/2016 with pneumonia.  Pharmacy has been consulted for Vancomycin dosing.  Plan: Vancomycin 2 g IV now, then 1500 mg IV q24h    Height: 5\' 11"  (180.3 cm) Weight: 261 lb 2 oz (118.4 kg) IBW/kg (Calculated) : 75.3  Temp (24hrs), Avg:97.9 F (36.6 C), Min:97.8 F (36.6 C), Max:98 F (36.7 C)   Recent Labs Lab 11/26/16 0054 11/26/16 0137  WBC 11.9*  --   CREATININE 1.00  --   LATICACIDVEN  --  1.87    Estimated Creatinine Clearance: 77.1 mL/min (by C-G formula based on SCr of 1 mg/dL).    Allergies  Allergen Reactions  . Oxycodone Other (See Comments)    nightmares  . Benadryl [Diphenhydramine] Other (See Comments)    unknown    Joseph Barrera, Joseph Barrera 11/26/2016 4:25 AM

## 2016-11-26 NOTE — H&P (Signed)
History and Physical    Joseph Barrera ZOX:[id number] DOB: [birth year] DOA: 11/26/2016  PCP: Patient, No Pcp Per   Patient coming from: Assisted Living Facility [facility])   Chief Complaint: Fall, hypoxia, productive cough   HPI: Joseph Barrera is a 83 y.o. male with medical history significant for COPD, chronic diastolic CHF, atrial fibrillation on warfarin, history of PE, and history of TIA, now presenting to the emergency department from his assisted living facility after an unwitnessed fall. Patient was admitted to the hospital in May 2018 for a community-acquired pneumonia, treated with Rocephin and azithromycin, and discharge back to his facility in much improved and stable condition. He reports doing well initially, though his productive cough never did resolve. Over the past few days, the cough has worsened significantly and he is been experiencing some nonspecific malaise and fatigue. He was having an uneventful day otherwise, but then the history becomes difficult; he was found on the floor of his room after falling from his desk chair, initially denied loss of consciousness or hitting his head, but later suggested that he may have hit his head and was unsure if he had had a brief loss of consciousness or not. He reports feeling hot and having some mild sweating over the past couple days, but had been attributing this to other residents at his facility turning the thermostat up too high. He denies any chest pain or palpitations, reports chronic lower extremity edema that is unchanged, and denies any significant orthopnea. EMS was called out, finding the patient to be saturating 85% on his usual 2 Lpm of supplemental O2.   ED Course: Upon arrival to the ED, patient is found to be afebrile, saturating adequately on 3 Lpm while at rest, mildly tachypneic, and with BP 100/60. EKG with atrial fib rate 110, CXR with increased bibasilar opacities suggestive of atelectasis vs PNA.  Chemistry panel with slight hyponatremia, biarb 35, and elevated BUN-to-creatinine ratio. CBC with mild leukocytosis. Lactate reassuring at 1.86, troponin and BNP, INR 3.05, and UA unremarkable. Head CT and c-spine CT are negative for acute findings. Patient was given a liter of normal saline in the ED and started on empiric Rocephin and azithromycin. He was ambulated a short distance with a drop in his O2 sat and development of tachycardia. He was very dyspneic with this. Patient is suspected of having a pneumonia, has history of MDR organisms, history of underlying heart disease (CHF and CAD), is dependent on oxygen at his baseline, and is at high risk for morbidity/mortality associated with this and will need to be treated in the hospital. He will be admitted to the telemetry unit for ongoing evaluation and management of HCAP.   Review of Systems:  All other systems reviewed and apart from HPI, are negative.  Past Medical History:  Diagnosis Date  . A-fib (HCC)   . Atrial fibrillation (HCC)   . Bronchitis   . CAD (coronary artery disease)   . CHF (congestive heart failure) (HCC)   . Constipation   . Edema   . Gait disturbance   . GERD (gastroesophageal reflux disease)   . Hypertension   . Obesity   . Pulmonary embolism (HCC)   . Restless leg syndrome   . Sleep hypopnea   . TIA (transient ischemic attack)   . Urinary bladder incontinence     Past Surgical History:  Procedure Laterality Date  . ADENOIDECTOMY    . APPENDECTOMY    . CHOLECYSTECTOMY    .  HIP SURGERY Left   . KNEE SURGERY Bilateral   . REPLACEMENT TOTAL KNEE BILATERAL    . TONSILLECTOMY    . TOTAL HIP ARTHROPLASTY Left      reports that he quit smoking about 55 years ago. His smoking use included Cigarettes. He has never used smokeless tobacco. He reports that he does not drink alcohol or use drugs.  Allergies  Allergen Reactions  . Oxycodone Other (See Comments)    nightmares  . Benadryl [Diphenhydramine]  Other (See Comments)    unknown    Family History  Problem Relation Age of Onset  . CVA Father   . Lead poisoning Brother      Prior to Admission medications   Medication Sig Start Date End Date Taking? Authorizing Provider  b complex vitamins capsule Take 1 capsule by mouth daily.    [provider]  cetirizine (ZYRTEC) 10 MG tablet Take 10 mg by mouth daily.    [provider]  Cholecalciferol (VITAMIN D3 ULTRA STRENGTH) 5000 units capsule Take 5,000 Units by mouth daily.    [provider]  dextromethorphan-guaiFENesin (MUCINEX DM) 30-600 MG 12hr tablet Take 1 tablet by mouth 2 (two) times daily. 09/27/16   Rai, Delene Ruffini, MD  dicyclomine (BENTYL) 20 MG tablet Take 20 mg by mouth every 6 (six) hours.    [provider]  docusate sodium (COLACE) 100 MG capsule Take 100 mg by mouth 2 (two) times daily as needed for mild constipation.    [provider]  furosemide (LASIX) 40 MG tablet Take 40 mg by mouth 2 (two) times daily.    [provider]  gabapentin (NEURONTIN) 300 MG capsule Take 300mg  nightly for 1 week then increase to 2 tablets nightly. Patient taking differently: Take 600 mg by mouth at bedtime. Take 300mg  nightly for 1 week then increase to 2 tablets nightly. 05/03/16   Armbruster, Reeves Forth, MD  ipratropium-albuterol (DUONEB) 0.5-2.5 (3) MG/3ML SOLN Take 3 mLs by nebulization every 6 (six) hours as needed (shortness of breath).     [provider]  metoprolol tartrate (LOPRESSOR) 50 MG tablet Take 1 tablet (50 mg total) by mouth 2 (two) times daily. 11/01/16 01/30/17  Jake Bathe, MD  Multiple Vitamins-Minerals (MULTIVITAMIN ADULT) TABS Take 1 tablet by mouth daily.    [provider]  omeprazole (PRILOSEC) 20 MG capsule Take 20 mg by mouth daily.    [provider]  OXYGEN Inhale 2 L into the lungs daily as needed (SOB).    [provider]  polyethylene glycol powder  (GLYCOLAX/MIRALAX) powder Mix 17g in 8oz of water twice daily. May titrate as needed. 09/20/16   Armbruster, Reeves Forth, MD  potassium chloride SA (K-DUR,KLOR-CON) 20 MEQ tablet Take 20 mEq by mouth 2 (two) times daily.    [provider]  rOPINIRole (REQUIP) 4 MG tablet Take 4 mg by mouth at bedtime.    [provider]  spironolactone (ALDACTONE) 50 MG tablet Take 50 mg by mouth 2 (two) times daily.    [provider]  traMADol (ULTRAM) 50 MG tablet Take 1 tablet (50 mg total) by mouth every 6 (six) hours as needed for moderate pain. 09/27/16   Rai, Delene Ruffini, MD  vitamin B-12 (CYANOCOBALAMIN) 500 MCG tablet Take 500 mcg by mouth daily.    [provider]  warfarin (COUMADIN) 7.5 MG tablet Take 7.5 mg by mouth daily.    [provider]    Physical Exam: Vitals:  11/26/16 0238 11/26/16 0300 11/26/16 0337 11/26/16 0400  BP:  94/62 105/72 102/73  Pulse:  88 (!) 116 (!) 115  Resp:  (!) 23 (!) 22 (!) 22  Temp: 98 F (36.7 C)     TempSrc: Rectal     SpO2:  94% 93% 96%  Weight:      Height:          Constitutional: Not in acute distress. Appears uncomfortable, actively coughing up thick yellow sputum.  Eyes: PERTLA, lids and conjunctivae normal ENMT: Mucous membranes are moist. Posterior pharynx clear of any exudate or lesions.   Neck: normal, supple, no masses, no thyromegaly Respiratory: Mild tachypnea, coarse rhonchi at bilateral bases and mid-lung zones. No pallor or cyanosis. No accessory muscle use.  Cardiovascular: Rate ~100 and irregular. 2+ pretibial edema bilaterally. JVP not well-visualized. Abdomen: No distension, no tenderness, no masses palpated. Bowel sounds normal.  Musculoskeletal: no clubbing / cyanosis. No joint deformity upper and lower extremities.   Skin: no significant rashes, lesions, ulcers. Warm, dry, well-perfused. Neurologic: CN 2-12 grossly intact. Sensation intact, DTR normal. Strength 5/5 in all 4 limbs.    Psychiatric: Alert and oriented x 3. Pleasant and cooperative.     Labs on Admission: I have personally reviewed following labs and imaging studies  CBC:  Recent Labs Lab 11/26/16 0054  WBC 11.9*  NEUTROABS 9.4*  HGB 14.9  HCT 45.7  MCV 96.4  PLT 265   Basic Metabolic Panel:  Recent Labs Lab 11/26/16 0054  NA 134*  K 4.6  CL 91*  CO2 35*  GLUCOSE 112*  BUN 27*  CREATININE 1.00  CALCIUM 9.1   GFR: Estimated Creatinine Clearance: 77.1 mL/min (by C-G formula based on SCr of 1 mg/dL). Liver Function Tests:  Recent Labs Lab 11/26/16 0054  AST 26  ALT 15*  ALKPHOS 63  BILITOT 0.5  PROT 7.4  ALBUMIN 3.1*   No results for input(s): LIPASE, AMYLASE in the last 168 hours. No results for input(s): AMMONIA in the last 168 hours. Coagulation Profile:  Recent Labs Lab 11/26/16 0054  INR 3.06   Cardiac Enzymes:  Recent Labs Lab 11/26/16 0121  CKTOTAL 80  TROPONINI <0.03   BNP (last 3 results) No results for input(s): PROBNP in the last 8760 hours. HbA1C: No results for input(s): HGBA1C in the last 72 hours. CBG:  Recent Labs Lab 11/26/16 0135  GLUCAP 114*   Lipid Profile: No results for input(s): CHOL, HDL, LDLCALC, TRIG, CHOLHDL, LDLDIRECT in the last 72 hours. Thyroid Function Tests: No results for input(s): TSH, T4TOTAL, FREET4, T3FREE, THYROIDAB in the last 72 hours. Anemia Panel: No results for input(s): VITAMINB12, FOLATE, FERRITIN, TIBC, IRON, RETICCTPCT in the last 72 hours. Urine analysis:    Component Value Date/Time   COLORURINE YELLOW 11/26/2016 0311   APPEARANCEUR CLEAR 11/26/2016 0311   LABSPEC 1.011 11/26/2016 0311   PHURINE 6.0 11/26/2016 0311   GLUCOSEU NEGATIVE 11/26/2016 0311   HGBUR NEGATIVE 11/26/2016 0311   BILIRUBINUR NEGATIVE 11/26/2016 0311   KETONESUR NEGATIVE 11/26/2016 0311   PROTEINUR NEGATIVE 11/26/2016 0311   NITRITE NEGATIVE 11/26/2016 0311   LEUKOCYTESUR NEGATIVE 11/26/2016 0311   Sepsis  Labs: @LABRCNTIP (procalcitonin:4,lacticidven:4) )No results found for this or any previous visit (from the past 240 hour(s)).   Radiological Exams on Admission: Dg Chest 2 View  Result Date: 11/26/2016 CLINICAL DATA:  Sepsis shortness of breath EXAM: CHEST  2 VIEW COMPARISON:  09/23/2016 FINDINGS: Low lung volumes. Cardiomegaly. Increased left greater than right bibasilar  opacity may reflect atelectasis or pneumonia. Aortic atherosclerosis. No pneumothorax. IMPRESSION: Low lung volumes. Increased opacity at the left greater than right lung bases may reflect atelectasis or pneumonia. Electronically Signed   By: Jasmine Pang M.D.   On: 11/26/2016 02:29   Ct Head Wo Contrast  Result Date: 11/26/2016 CLINICAL DATA:  83 year old male with syncope and fall. EXAM: CT HEAD WITHOUT CONTRAST CT CERVICAL SPINE WITHOUT CONTRAST TECHNIQUE: Multidetector CT imaging of the head and cervical spine was performed following the standard protocol without intravenous contrast. Multiplanar CT image reconstructions of the cervical spine were also generated. COMPARISON:  Head CT dated 10/11/2016 FINDINGS: CT HEAD FINDINGS Brain: There is moderate age-related atrophy and chronic microvascular ischemic changes. No acute intracranial hemorrhage. No mass effect or midline shift noted. No extra-axial fluid collection. Vascular: No hyperdense vessel or unexpected calcification. Skull: Normal. Negative for fracture or focal lesion. Sinuses/Orbits: No acute finding. Other: None CT CERVICAL SPINE FINDINGS Alignment: No acute subluxation. Skull base and vertebrae: No acute fracture.  Osteopenia. Soft tissues and spinal canal: No prevertebral fluid or swelling. No visible canal hematoma. Disc levels: Multilevel degenerative changes with endplate irregularity and disc space narrowing. Upper chest: Negative. Other: None IMPRESSION: 1. No acute intracranial hemorrhage. Moderate age-related atrophy and chronic microvascular ischemic changes. 2.  No acute/traumatic cervical spine pathology. Degenerative changes. Electronically Signed   By: Elgie Collard M.D.   On: 11/26/2016 02:31   Ct Cervical Spine Wo Contrast  Result Date: 11/26/2016 CLINICAL DATA:  83 year old male with syncope and fall. EXAM: CT HEAD WITHOUT CONTRAST CT CERVICAL SPINE WITHOUT CONTRAST TECHNIQUE: Multidetector CT imaging of the head and cervical spine was performed following the standard protocol without intravenous contrast. Multiplanar CT image reconstructions of the cervical spine were also generated. COMPARISON:  Head CT dated 10/11/2016 FINDINGS: CT HEAD FINDINGS Brain: There is moderate age-related atrophy and chronic microvascular ischemic changes. No acute intracranial hemorrhage. No mass effect or midline shift noted. No extra-axial fluid collection. Vascular: No hyperdense vessel or unexpected calcification. Skull: Normal. Negative for fracture or focal lesion. Sinuses/Orbits: No acute finding. Other: None CT CERVICAL SPINE FINDINGS Alignment: No acute subluxation. Skull base and vertebrae: No acute fracture.  Osteopenia. Soft tissues and spinal canal: No prevertebral fluid or swelling. No visible canal hematoma. Disc levels: Multilevel degenerative changes with endplate irregularity and disc space narrowing. Upper chest: Negative. Other: None IMPRESSION: 1. No acute intracranial hemorrhage. Moderate age-related atrophy and chronic microvascular ischemic changes. 2. No acute/traumatic cervical spine pathology. Degenerative changes. Electronically Signed   By: Elgie Collard M.D.   On: 11/26/2016 02:31    EKG: Independently reviewed. Atrial fibrillation, rate 110.   Assessment/Plan  1. HCAP  - Pt was hospitalized for several days in May 2018, treated for PNA, had marked improvement but never fully recovered  - Now presents with increased O2-requirement, worsening productive cough, new leukocytosis, and increased airspace opacities on CXR  - Given his age and  underlying cardiopulmonary disease, will need to be treated inpatient - Given recent hospitalization and hx of MRSA carriage, will treat with empiric vancomycin and cefepime  - Check sputum culture and gram stain, check strep pneumo and legionella antigens   - Continue supplemental O2, prn antipyretics, prn mucolytic, trend pro-calcitonin    2. COPD with chronic hypoxic respiratory failure  - Pt presents with HCAP, increased O2-requirement  - No significant wheezing or obstructed breathing pattern  - Continue prn nebs    3. Chronic diastolic CHF  - Pt  appears a little hypervolemic on admission, though BNP noted to be wnl and there is an elevated BUN-to-Cr ratio - Plan to SLIV, follow daily wts and I/O's, hold diuretics initially and resume as needed  - Continue beta-blocker with holding parameters    4. Atrial fibrillation  - Pt in a fib on admission, initially with rate in 1-teen's, settling down to 90's in ED  - CHADS-VASc is 61 (age x2, TIA x2, CHF, CAD) - Continue warfarin, Lopressor    5. GERD - No EGD report on file - Managed with daily PPI, will continue    DVT prophylaxis: warfarin  Code Status: Full  Family Communication: Discussed with patient Disposition Plan: Admit to telemetry Consults called: None Admission status: Inpatient    Briscoe Deutscher, MD [facility] Pager [phone]  If 7PM-7AM, please contact night-coverage [email or web address] Password [email or web address]  11/26/2016, 4:12 AM

## 2016-11-26 NOTE — ED Triage Notes (Signed)
Reports being from [facility].  Called for unwitnessed fall.  Per patient he was in a desk chair and doesn't remember the fall.  Recently diagnosed with pneumonia and treated.  Wears O2 at 2L.  On arrival of ems sat was 85%.  Placed on O2 at 3L in triage with sat of 96% at this time.  Hx of CHF and afib.  CBG-121

## 2016-11-26 NOTE — Progress Notes (Addendum)
Patient ID: Joseph Barrera, male   DOB: [birth year], 83 y.o.   MRN: [id number] Patient was admitted earlier this morning for probable pneumonia and started on intravenous antibiotics. I reviewed the medical records and this morning's history and physical myself. I spoke to him at bedside, examined him and discussed the plan of care. I reviewed labs and medications since admission. Continue antibiotics. Repeat a.m. labs. PT evaluation. SLP evaluation for recurrent pneumonia.

## 2016-11-27 LAB — CBC WITH DIFFERENTIAL/PLATELET
BASOS ABS: 0 10*3/uL (ref 0.0–0.1)
BASOS PCT: 0 %
EOS ABS: 0.1 10*3/uL (ref 0.0–0.7)
Eosinophils Relative: 1 %
HEMATOCRIT: 44.3 % (ref 39.0–52.0)
Hemoglobin: 14.4 g/dL (ref 13.0–17.0)
Lymphocytes Relative: 16 %
Lymphs Abs: 1.6 10*3/uL (ref 0.7–4.0)
MCH: 31.9 pg (ref 26.0–34.0)
MCHC: 32.5 g/dL (ref 30.0–36.0)
MCV: 98.2 fL (ref 78.0–100.0)
MONO ABS: 0.7 10*3/uL (ref 0.1–1.0)
MONOS PCT: 7 %
NEUTROS ABS: 7.9 10*3/uL — AB (ref 1.7–7.7)
NEUTROS PCT: 76 %
Platelets: 233 10*3/uL (ref 150–400)
RBC: 4.51 MIL/uL (ref 4.22–5.81)
RDW: 13.7 % (ref 11.5–15.5)
WBC: 10.4 10*3/uL (ref 4.0–10.5)

## 2016-11-27 LAB — COMPREHENSIVE METABOLIC PANEL
ALBUMIN: 2.8 g/dL — AB (ref 3.5–5.0)
ALT: 14 U/L — ABNORMAL LOW (ref 17–63)
ANION GAP: 7 (ref 5–15)
AST: 25 U/L (ref 15–41)
Alkaline Phosphatase: 60 U/L (ref 38–126)
BILIRUBIN TOTAL: 1 mg/dL (ref 0.3–1.2)
BUN: 14 mg/dL (ref 6–20)
CHLORIDE: 95 mmol/L — AB (ref 101–111)
CO2: 29 mmol/L (ref 22–32)
Calcium: 8.7 mg/dL — ABNORMAL LOW (ref 8.9–10.3)
Creatinine, Ser: 0.75 mg/dL (ref 0.61–1.24)
GFR calc Af Amer: >60 mL/min (ref >60)
GFR calc non Af Amer: >60 mL/min (ref >60)
GLUCOSE: 115 mg/dL — AB (ref 65–99)
POTASSIUM: 4.2 mmol/L (ref 3.5–5.1)
SODIUM: 131 mmol/L — AB (ref 135–145)
Total Protein: 6.9 g/dL (ref 6.5–8.1)

## 2016-11-27 LAB — MAGNESIUM: Magnesium: 2.1 mg/dL (ref 1.7–2.4)

## 2016-11-27 LAB — PROTIME-INR
INR: 2.75
PROTHROMBIN TIME: 29.7 s — AB (ref 11.4–15.2)

## 2016-11-27 LAB — LEGIONELLA PNEUMOPHILA SEROGP 1 UR AG: L. pneumophila Serogp 1 Ur Ag: NEGATIVE

## 2016-11-27 MED ORDER — WARFARIN SODIUM 7.5 MG PO TABS
7.5000 mg | ORAL_TABLET | Freq: Once | ORAL | Status: AC
  Administered 2016-11-27: 7.5 mg via ORAL
  Filled 2016-11-27: qty 1

## 2016-11-27 NOTE — Progress Notes (Signed)
Patient ID: Joseph Barrera, male   DOB: [birth year], 83 y.o.   MRN: [id number]  PROGRESS NOTE    Neldon LabellaRonald Charles Seefeldt  WUJ:[id number] DOB: [birth year] DOA: 11/26/2016 PCP: Patient, No Pcp Per   Brief Narrative:  83 year old male with history of COPD, chronic diastolic heart failure, atrial fibrillation on warfarin, history of PE and TIA, history of hospitalization in May 2018 for treatment of community-acquired pneumonia presented with hypoxia and cough and was admitted with pneumonia. He feels slightly better.  Assessment & Plan:   Principal Problem:   HCAP (healthcare-associated pneumonia) Active Problems:   Atrial fibrillation, chronic (HCC)   History of pulmonary embolism   GERD (gastroesophageal reflux disease)   Chronic diastolic CHF (congestive heart failure) (HCC)   MRSA carrier   Chronic respiratory failure with hypoxia (HCC)   Pressure injury of skin   1. HCAP  - Continue withcefepime and vancomycin. Follow cultures. MRSA nares is positive. - Continue oxygen supplementation - Patient is feeling better. - Diet as per SLP evaluation and recommendations  2. COPD with chronic hypoxic respiratory failure  - No significant wheezing or obstructed breathing pattern  - Continue prn nebs    3. Chronic diastolic CHF  - follow daily wts and I/O's - Diuretics on hold for now. Probably will resume diuretics tomorrow if blood pressure allows. - Continue beta-blocker with holding parameters    4. Atrial fibrillation  - Currently rate controlled - Continue warfarin, Lopressor    5. GERD - Managed with daily PPI, will continue   DVT prophylaxis: Coumadin Code Status:  Full Family Communication: None at bedside Disposition Plan: Probably back to assisted living facility in 1-3 days  Consultants: None   Procedures: None  Antimicrobials: Cefepime and vancomycin from 11/26/2016   Subjective: Patient seen and examined at bedside. He feels slightly better although  still coughing and short of breath. No overnight fever, nausea, vomiting.  Objective: Vitals:   11/27/16 0433 11/27/16 0845 11/27/16 1024 11/27/16 1219  BP: (!) 104/53 93/60  98/69  Pulse: 77   68  Resp: 19   18  Temp: 98.2 F (36.8 C)   98.4 F (36.9 C)  TempSrc: Oral   Oral  SpO2: 91%  93% 95%  Weight: 121 kg (266 lb 12.8 oz)     Height:        Intake/Output Summary (Last 24 hours) at 11/27/16 1342 Last data filed at 11/27/16 0849  Gross per 24 hour  Intake             1080 ml  Output             1400 ml  Net             -320 ml   Filed Weights   11/26/16 0045 11/27/16 0433  Weight: 118.4 kg (261 lb 2 oz) 121 kg (266 lb 12.8 oz)    Examination:  General exam: Appears calm and comfortable  Respiratory system: Bilateral decreased breath sound at basesWith scattered crackles Cardiovascular system: S1 & S2 heard, rate controlled  Gastrointestinal system: Abdomen is nondistended, soft and nontender. Normal bowel sounds heard. Extremities: No cyanosis, clubbing; trace pedal edema    Data Reviewed: I have personally reviewed following labs and imaging studies  CBC:  Recent Labs Lab 11/26/16 0054 11/27/16 0839  WBC 11.9* 10.4  NEUTROABS 9.4* 7.9*  HGB 14.9 14.4  HCT 45.7 44.3  MCV 96.4 98.2  PLT 265 233   Basic Metabolic Panel:  Recent Labs  Lab 11/26/16 0054 11/27/16 0839  NA 134* 131*  K 4.6 4.2  CL 91* 95*  CO2 35* 29  GLUCOSE 112* 115*  BUN 27* 14  CREATININE 1.00 0.75  CALCIUM 9.1 8.7*  MG  --  2.1   GFR: Estimated Creatinine Clearance: 97.5 mL/min (by C-G formula based on SCr of 0.75 mg/dL). Liver Function Tests:  Recent Labs Lab 11/26/16 0054 11/27/16 0839  AST 26 25  ALT 15* 14*  ALKPHOS 63 60  BILITOT 0.5 1.0  PROT 7.4 6.9  ALBUMIN 3.1* 2.8*   No results for input(s): LIPASE, AMYLASE in the last 168 hours. No results for input(s): AMMONIA in the last 168 hours. Coagulation Profile:  Recent Labs Lab 11/26/16 0054  11/27/16 0356  INR 3.06 2.75   Cardiac Enzymes:  Recent Labs Lab 11/26/16 0121  CKTOTAL 80  TROPONINI <0.03   BNP (last 3 results) No results for input(s): PROBNP in the last 8760 hours. HbA1C: No results for input(s): HGBA1C in the last 72 hours. CBG:  Recent Labs Lab 11/26/16 0135  GLUCAP 114*   Lipid Profile: No results for input(s): CHOL, HDL, LDLCALC, TRIG, CHOLHDL, LDLDIRECT in the last 72 hours. Thyroid Function Tests: No results for input(s): TSH, T4TOTAL, FREET4, T3FREE, THYROIDAB in the last 72 hours. Anemia Panel: No results for input(s): VITAMINB12, FOLATE, FERRITIN, TIBC, IRON, RETICCTPCT in the last 72 hours. Sepsis Labs:  Recent Labs Lab 11/26/16 0137 11/26/16 0235  PROCALCITON  --  <0.10  LATICACIDVEN 1.87  --     Recent Results (from the past 240 hour(s))  Culture, blood (Routine x 2)     Status: None (Preliminary result)   Collection Time: 11/26/16  1:25 AM  Result Value Ref Range Status   Specimen Description BLOOD RIGHT HAND  Final   Special Requests   Final    BOTTLES DRAWN AEROBIC AND ANAEROBIC Blood Culture adequate volume   Culture NO GROWTH 1 DAY  Final   Report Status PENDING  Incomplete  Culture, blood (Routine x 2)     Status: None (Preliminary result)   Collection Time: 11/26/16  1:30 AM  Result Value Ref Range Status   Specimen Description BLOOD LEFT WRIST  Final   Special Requests   Final    BOTTLES DRAWN AEROBIC AND ANAEROBIC Blood Culture adequate volume   Culture NO GROWTH 1 DAY  Final   Report Status PENDING  Incomplete  MRSA PCR Screening     Status: Abnormal   Collection Time: 11/26/16  9:49 AM  Result Value Ref Range Status   MRSA by PCR POSITIVE (A) NEGATIVE Final    Comment:        The GeneXpert MRSA Assay (FDA approved for NASAL specimens only), is one component of a comprehensive MRSA colonization surveillance program. It is not intended to diagnose MRSA infection nor to guide or monitor treatment for MRSA  infections. RESULT CALLED TO, READ BACK BY AND VERIFIED WITH: G.SISON RN AT 1206 11/26/16 BY A.DAVIS          Radiology Studies: Dg Chest 2 View  Result Date: 11/26/2016 CLINICAL DATA:  Sepsis shortness of breath EXAM: CHEST  2 VIEW COMPARISON:  09/23/2016 FINDINGS: Low lung volumes. Cardiomegaly. Increased left greater than right bibasilar opacity may reflect atelectasis or pneumonia. Aortic atherosclerosis. No pneumothorax. IMPRESSION: Low lung volumes. Increased opacity at the left greater than right lung bases may reflect atelectasis or pneumonia. Electronically Signed   By: Jasmine Pang M.D.   On: 11/26/2016  02:29   Ct Head Wo Contrast  Result Date: 11/26/2016 CLINICAL DATA:  83 year old male with syncope and fall. EXAM: CT HEAD WITHOUT CONTRAST CT CERVICAL SPINE WITHOUT CONTRAST TECHNIQUE: Multidetector CT imaging of the head and cervical spine was performed following the standard protocol without intravenous contrast. Multiplanar CT image reconstructions of the cervical spine were also generated. COMPARISON:  Head CT dated 10/11/2016 FINDINGS: CT HEAD FINDINGS Brain: There is moderate age-related atrophy and chronic microvascular ischemic changes. No acute intracranial hemorrhage. No mass effect or midline shift noted. No extra-axial fluid collection. Vascular: No hyperdense vessel or unexpected calcification. Skull: Normal. Negative for fracture or focal lesion. Sinuses/Orbits: No acute finding. Other: None CT CERVICAL SPINE FINDINGS Alignment: No acute subluxation. Skull base and vertebrae: No acute fracture.  Osteopenia. Soft tissues and spinal canal: No prevertebral fluid or swelling. No visible canal hematoma. Disc levels: Multilevel degenerative changes with endplate irregularity and disc space narrowing. Upper chest: Negative. Other: None IMPRESSION: 1. No acute intracranial hemorrhage. Moderate age-related atrophy and chronic microvascular ischemic changes. 2. No acute/traumatic cervical  spine pathology. Degenerative changes. Electronically Signed   By: Elgie Collard M.D.   On: 11/26/2016 02:31   Ct Cervical Spine Wo Contrast  Result Date: 11/26/2016 CLINICAL DATA:  83 year old male with syncope and fall. EXAM: CT HEAD WITHOUT CONTRAST CT CERVICAL SPINE WITHOUT CONTRAST TECHNIQUE: Multidetector CT imaging of the head and cervical spine was performed following the standard protocol without intravenous contrast. Multiplanar CT image reconstructions of the cervical spine were also generated. COMPARISON:  Head CT dated 10/11/2016 FINDINGS: CT HEAD FINDINGS Brain: There is moderate age-related atrophy and chronic microvascular ischemic changes. No acute intracranial hemorrhage. No mass effect or midline shift noted. No extra-axial fluid collection. Vascular: No hyperdense vessel or unexpected calcification. Skull: Normal. Negative for fracture or focal lesion. Sinuses/Orbits: No acute finding. Other: None CT CERVICAL SPINE FINDINGS Alignment: No acute subluxation. Skull base and vertebrae: No acute fracture.  Osteopenia. Soft tissues and spinal canal: No prevertebral fluid or swelling. No visible canal hematoma. Disc levels: Multilevel degenerative changes with endplate irregularity and disc space narrowing. Upper chest: Negative. Other: None IMPRESSION: 1. No acute intracranial hemorrhage. Moderate age-related atrophy and chronic microvascular ischemic changes. 2. No acute/traumatic cervical spine pathology. Degenerative changes. Electronically Signed   By: Elgie Collard M.D.   On: 11/26/2016 02:31        Scheduled Meds: . B-complex with vitamin C  1 tablet Oral Daily  . Chlorhexidine Gluconate Cloth  6 each Topical Q0600  . cholecalciferol  5,000 Units Oral Daily  . dextromethorphan-guaiFENesin  1 tablet Oral BID  . dicyclomine  20 mg Oral Q6H  . gabapentin  600 mg Oral QHS  . loratadine  10 mg Oral Daily  . metoprolol tartrate  50 mg Oral BID  . multivitamin with minerals  1  tablet Oral Daily  . mupirocin ointment  1 application Nasal BID  . pantoprazole  40 mg Oral Daily  . rOPINIRole  4 mg Oral QHS  . sodium chloride flush  3 mL Intravenous Q12H  . vitamin B-12  500 mcg Oral Daily  . warfarin  7.5 mg Oral ONCE-1800  . Warfarin - Pharmacist Dosing Inpatient   Does not apply q1800   Continuous Infusions: . sodium chloride    . ceFEPime (MAXIPIME) IV 1 g (11/27/16 1338)  . vancomycin Stopped (11/27/16 0721)     LOS: 1 day        Glade Lloyd, MD [facility]  Pager [phone]  If 7PM-7AM, please contact night-coverage [email or web address] Password [email or web address] 11/27/2016, 1:42 PM

## 2016-11-27 NOTE — Evaluation (Signed)
Clinical/Bedside Swallow Evaluation Patient Details  Name: Joseph Barrera MRN: [id number] Date of Birth: [birth year]  Today's Date: 11/27/2016 Time: SLP Start Time (ACUTE ONLY): 1613 SLP Stop Time (ACUTE ONLY): 1628 SLP Time Calculation (min) (ACUTE ONLY): 15 min  Past Medical History:  Past Medical History:  Diagnosis Date  . A-fib (HCC)   . Atrial fibrillation (HCC)   . Bronchitis   . CAD (coronary artery disease)   . CHF (congestive heart failure) (HCC)   . Constipation   . Edema   . Gait disturbance   . GERD (gastroesophageal reflux disease)   . Hypertension   . Incontinence   . Obesity   . Pneumonia 11/26/2016  . Pulmonary embolism (HCC)   . Restless leg syndrome   . Sleep hypopnea   . TIA (transient ischemic attack)   . Urinary bladder incontinence    Past Surgical History:  Past Surgical History:  Procedure Laterality Date  . ADENOIDECTOMY    . APPENDECTOMY    . CHOLECYSTECTOMY    . HIP SURGERY Left   . KNEE SURGERY Bilateral   . REPLACEMENT TOTAL KNEE BILATERAL    . TONSILLECTOMY    . TOTAL HIP ARTHROPLASTY Left    HPI:  83 year old male with history of COPD, chronic diastolic heart failure, atrial fibrillation on warfarin, history of PE and TIA, GERD, history of hospitalization in May 2018 for treatment of community-acquired pneumonia presented with hypoxia and cough and was admitted with pneumonia.  Clinical swallow eval during May 2018 admission revealed no s/s of aspiration; pt careful/deliberate with eating, requires upright positioning for PO intake.    Assessment / Plan / Recommendation Clinical Impression  Pt presents with functional oropharyngeal swallow with adequate mastication; brisk swallow; no s/s of aspiration.  Passed three oz water screen without difficulty.  Pt describes chronic esophageal issues, including regurgitation.  He avoids foods that trigger heightened esophageal symptoms.  Recommend continuing current regular solid diet, thin  liquids.  No SLP f/u is needed.  SLP Visit Diagnosis: Dysphagia, unspecified (R13.10)    Aspiration Risk  No limitations    Diet Recommendation   regular solids, thin liquids  Medication Administration: Whole meds with liquid    Other  Recommendations Oral Care Recommendations: Oral care BID   Follow up Recommendations None      Frequency and Duration            Prognosis        Swallow Study   General Date of Onset: 11/26/16 HPI: 83 year old male with history of COPD, chronic diastolic heart failure, atrial fibrillation on warfarin, history of PE and TIA, GERD, history of hospitalization in May 2018 for treatment of community-acquired pneumonia presented with hypoxia and cough and was admitted with pneumonia.  Clinical swallow eval during May 2018 admission revealed no s/s of aspiration; pt careful/deliberate with eating, requires upright positioning for PO intake.  Type of Study: Bedside Swallow Evaluation Previous Swallow Assessment: see HPI Diet Prior to this Study: Regular;Thin liquids Temperature Spikes Noted: No Respiratory Status: Nasal cannula History of Recent Intubation: No Behavior/Cognition: Alert;Cooperative Oral Cavity Assessment: Within Functional Limits Oral Care Completed by SLP: No Oral Cavity - Dentition: Adequate natural dentition Vision: Functional for self-feeding Self-Feeding Abilities: Able to feed self Patient Positioning: Upright in chair Baseline Vocal Quality: Normal Volitional Cough: Congested Volitional Swallow: Able to elicit    Oral/Motor/Sensory Function Overall Oral Motor/Sensory Function: Within functional limits   Ice Chips Ice chips: Within functional limits   Thin Liquid  Thin Liquid: Within functional limits    Nectar Thick Nectar Thick Liquid: Not tested   Honey Thick Honey Thick Liquid: Not tested   Puree Puree: Within functional limits   Solid   GO   Solid: Within functional limits        Blenda MountsCouture, Irini Leet  Laurice 11/27/2016,4:29 PM  Marchelle FolksAmanda L. Samson Fredericouture, [place] CCC/SLP Pager [phone]

## 2016-11-27 NOTE — Progress Notes (Signed)
ANTICOAGULATION CONSULT NOTE - Follow Up Consult  Pharmacy Consult for Coumadin Indication: atrial fibrillation  Allergies  Allergen Reactions  . Oxycodone Other (See Comments)    nightmares  . Benadryl [Diphenhydramine] Other (See Comments)    unknown    Patient Measurements: Height: 5\' 11"  (180.3 cm) Weight: 266 lb 12.8 oz (121 kg) (Pt unable to stand due to weakness.) IBW/kg (Calculated) : 75.3  Vital Signs: Temp: 98.4 F (36.9 C) (07/10 1219) Temp Source: Oral (07/10 1219) BP: 98/69 (07/10 1219) Pulse Rate: 68 (07/10 1219)  Labs:  Recent Labs  11/26/16 0054 11/26/16 0121 11/27/16 0356 11/27/16 0839  HGB 14.9  --   --  14.4  HCT 45.7  --   --  44.3  PLT 265  --   --  233  LABPROT 32.3*  --  29.7*  --   INR 3.06  --  2.75  --   CREATININE 1.00  --   --  0.75  CKTOTAL  --  80  --   --   TROPONINI  --  <0.03  --   --     Estimated Creatinine Clearance: 97.5 mL/min (by C-G formula based on SCr of 0.75 mg/dL).  Assessment:   Continues on Coumadin as prior to admission for afib.   INR therapeutic (2.75).  Coumadin dose held 7/9 with INR slightly supratherapeutic (3.06).    Home Coumadin regimen: 7.5 mg daily  Goal of Therapy:  INR 2-3 Monitor platelets by anticoagulation protocol: Yes   Plan:   Coumadin 7.5 mg today.  Daily PT/INR for now.  Dennie FettersEgan, Abigial Newville Donovan, RPh Pager: [phone] 11/27/2016,1:31 PM

## 2016-11-27 NOTE — Evaluation (Signed)
Physical Therapy Evaluation Patient Details Name: Joseph Barrera MRN: [id number] DOB: 1934-03-06 Today's Date: 11/27/2016   History of Present Illness   Joseph Barrera is a 83 y.o. male with medical history significant for L shoulder fx, COPD, chronic diastolic CHF, atrial fibrillation on warfarin, history of PE, and history of TIA, now presenting to the emergency department from his assisted living facility after an unwitnessed fall and admitted with PNA.  Pt was admitted to hospital in May for PNA as well.  Clinical Impression  Pt admitted with above diagnosis. Pt currently with functional limitations due to the deficits listed below (see PT Problem List). Pt will benefit from skilled PT to increase their independence and safety with mobility to allow discharge to the venue listed below.  Pt may benefit from use of RW rather than rollator and will assess next treatment session.  Discussed ALF and pt reports that they will have someone who can walk with him from his room to dining room and they have a PT in the facility.  He would benefit from continued therapy at ALF.    Follow Up Recommendations Home health PT;Supervision - Intermittent;Supervision for mobility/OOB    Equipment Recommendations  Rolling walker with 5" wheels    Recommendations for Other Services       Precautions / Restrictions Precautions Precautions: Fall Precaution Comments: 2 falls in last 6 months Restrictions Weight Bearing Restrictions: No      Mobility  Bed Mobility Overal bed mobility: Modified Independent             General bed mobility comments: Increased time with HOB elevated   Transfers Overall transfer level: Needs assistance Equipment used: Rolling walker (2 wheeled) Transfers: Sit to/from Stand Sit to Stand: Min guard         General transfer comment: min/guard for safety only  Ambulation/Gait Ambulation/Gait assistance: Min guard Ambulation Distance (Feet): 30  Feet Assistive device: Rolling walker (2 wheeled) Gait Pattern/deviations: Decreased step length - right;Decreased step length - left;Trunk flexed     General Gait Details: Amb on 2 L/min via nasal canula with o2 93% after gait.  Pt overall steady with RW and min/guard, but definitely needed support of RW.  Stairs            Wheelchair Mobility    Modified Rankin (Stroke Patients Only)       Balance Overall balance assessment: Needs assistance;History of Falls Sitting-balance support: Feet supported Sitting balance-Leahy Scale: Good     Standing balance support: Bilateral upper extremity supported Standing balance-Leahy Scale: Poor                               Pertinent Vitals/Pain Pain Assessment: No/denies pain    Home Living Family/patient expects to be discharged to:: Assisted living               Home Equipment: Walker - 4 wheels;Bedside commode;Shower seat - built in Additional Comments: wears o2    Prior Function Level of Independence: Needs assistance   Gait / Transfers Assistance Needed: Ambulates facility distances with rollator, but states he never uses the seat portion  ADL's / Homemaking Assistance Needed: has help for cooking and housekeeping and dressing        Hand Dominance        Extremity/Trunk Assessment   Upper Extremity Assessment Upper Extremity Assessment: LUE deficits/detail LUE Deficits / Details: hx of L shoulder fx which  limits ROM LUE Coordination: decreased gross motor    Lower Extremity Assessment Lower Extremity Assessment: Overall WFL for tasks assessed;Generalized weakness    Cervical / Trunk Assessment Cervical / Trunk Assessment: Kyphotic  Communication   Communication: No difficulties  Cognition Arousal/Alertness: Awake/alert Behavior During Therapy: WFL for tasks assessed/performed Overall Cognitive Status: Within Functional Limits for tasks assessed                                  General Comments: Pt overall oriented, but some confusion with dates noted.      General Comments      Exercises     Assessment/Plan    PT Assessment Patient needs continued PT services  PT Problem List Decreased strength;Decreased activity tolerance;Decreased balance;Decreased mobility       PT Treatment Interventions DME instruction;Gait training;Functional mobility training;Therapeutic activities;Therapeutic exercise;Balance training    PT Goals (Current goals can be found in the Care Plan section)  Acute Rehab PT Goals Patient Stated Goal: return to ALF and get PT there PT Goal Formulation: With patient Time For Goal Achievement: 12/11/16 Potential to Achieve Goals: Good    Frequency Min 3X/week   Barriers to discharge        Co-evaluation               AM-PAC PT "6 Clicks" Daily Activity  Outcome Measure Difficulty turning over in bed (including adjusting bedclothes, sheets and blankets)?: None Difficulty moving from lying on back to sitting on the side of the bed? : None Difficulty sitting down on and standing up from a chair with arms (e.g., wheelchair, bedside commode, etc,.)?: A Little Help needed moving to and from a bed to chair (including a wheelchair)?: A Little Help needed walking in hospital room?: A Little Help needed climbing 3-5 steps with a railing? : A Lot 6 Click Score: 19    End of Session Equipment Utilized During Treatment: Gait belt Activity Tolerance: Patient tolerated treatment well;Patient limited by fatigue Patient left: in chair;with call bell/phone within reach;with chair alarm set Nurse Communication: Mobility status PT Visit Diagnosis: Muscle weakness (generalized) (M62.81);Difficulty in walking, not elsewhere classified (R26.2)    Time: [id number] PT Time Calculation (min) (ACUTE ONLY): 37 min   Charges:   PT Evaluation $PT Eval Moderate Complexity: 1 Procedure PT Treatments $Gait Training: 8-22 mins   PT G  Codes:        Garfield Coiner L. Katrinka BlazingSmith, [place]PT Pager [phone] 11/27/2016   Enzo MontgomeryKaren L Viviann Broyles 11/27/2016, 10:33 AM

## 2016-11-27 NOTE — [place] FL2 (Signed)
[place] MEDICAID FL2 LEVEL OF CARE SCREENING TOOL     IDENTIFICATION  Patient Name: Joseph Barrera Birthdate: [birth year] Sex: male Admission Date (Current Location): 11/26/2016  [facility] and [place] Number:  Producer, television/film/video and Address:  [facility] [place], [address] [place], [place] [address]      Provider Number: [id number]  Attending Physician Name and Address:  Glade Lloyd, MD  Relative Name and Phone Number:       Current Level of Care: Hospital Recommended Level of Care: Assisted Living Facility (with PT) Prior Approval Number:    Date Approved/Denied:   PASRR Number:    Discharge Plan: Other (Comment) (ALF with PT)    Current Diagnoses: Patient Active Problem List   Diagnosis Date Noted  . HCAP (healthcare-associated pneumonia) 11/26/2016  . Chronic respiratory failure with hypoxia (HCC) 11/26/2016  . Pressure injury of skin 11/26/2016  . Obesity, Class II, BMI 35.0-39.9, with comorbidity (see actual BMI) 09/24/2016  . MRSA carrier 09/24/2016  . Venous stasis of both lower extremities 09/24/2016  . Sinus pause 09/24/2016  . CAP (community acquired pneumonia) 09/23/2016  . Acute on chronic respiratory failure with hypoxia (HCC) 09/23/2016  . Abdominal pain 09/23/2016  . GERD (gastroesophageal reflux disease) 09/23/2016  . Depression 09/23/2016  . Chronic diastolic CHF (congestive heart failure) (HCC) 09/23/2016  . Atrial fibrillation, chronic (HCC) 04/06/2016  . History of pulmonary embolism 04/06/2016  . Restless legs syndrome (RLS) 04/06/2016  . Sleep hypopnea 04/06/2016  . Constipation 04/06/2016    Orientation RESPIRATION BLADDER Height & Weight     Self, Time, Situation, Place  O2 (Nasal Canula 2 L.) Incontinent Weight: 266 lb 12.8 oz (121 kg) (Pt unable to stand due to weakness.) Height:  5\' 11"  (180.3 cm)  BEHAVIORAL SYMPTOMS/MOOD NEUROLOGICAL BOWEL NUTRITION STATUS   (None)  (None) Continent Diet (In  hospital: Heart healthy, fluid restriction 1500 mL.)  AMBULATORY STATUS COMMUNICATION OF NEEDS Skin   Limited Assist Verbally Other (Comment), PU Stage and Appropriate Care (Cellulitis, MASD.)   PU Stage 2 Dressing:  (Right, left sacrum: Foam prn.)                   Personal Care Assistance Level of Assistance              Functional Limitations Info  Sight, Hearing, Speech Sight Info: Adequate Hearing Info: Adequate Speech Info: Adequate    SPECIAL CARE FACTORS FREQUENCY  PT (By licensed PT)     PT Frequency: 3 x week              Contractures Contractures Info: Not present    Additional Factors Info  Code Status, Allergies, Isolation Precautions, Psychotropic Code Status Info: Full Allergies Info: Oxycodone, Benadryl (Diphendydramine) Psychotropic Info: Depression   Isolation Precautions Info: Contact: MRSA     Current Medications (11/27/2016):  This is the current hospital active medication list Current Facility-Administered Medications  Medication Dose Route Frequency Provider Last Rate Last Dose  . 0.9 %  sodium chloride infusion  250 mL Intravenous PRN Opyd, Lavone Neri, MD      . acetaminophen (TYLENOL) tablet 650 mg  650 mg Oral Q6H PRN Opyd, Lavone Neri, MD      . B-complex with vitamin C tablet 1 tablet  1 tablet Oral Daily Opyd, Lavone Neri, MD   1 tablet at 11/27/16 0841  . ceFEPIme (MAXIPIME) 1 g in dextrose 5 % 50 mL IVPB  1 g Intravenous  Q8H Briscoe Deutscherpyd, Timothy S, MD 100 mL/hr at 11/27/16 1338 1 g at 11/27/16 1338  . Chlorhexidine Gluconate Cloth 2 % PADS 6 each  6 each Topical Q0600 Scarlett Prestogan, Theresa D, RPH   6 each at 11/27/16 0600  . cholecalciferol (VITAMIN D) tablet 5,000 Units  5,000 Units Oral Daily Opyd, Lavone Neriimothy S, MD   5,000 Units at 11/27/16 [phone] [id number]  . dextromethorphan-guaiFENesin (MUCINEX DM) 30-600 MG per 12 hr tablet 1 tablet  1 tablet Oral BID Opyd, Lavone Neriimothy S, MD   1 tablet at 11/27/16 0843  . dicyclomine (BENTYL) tablet 20 mg  20 mg Oral Q6H Opyd,  Lavone Neriimothy S, MD   20 mg at 11/27/16 1158  . docusate sodium (COLACE) capsule 100 mg  100 mg Oral BID PRN Opyd, Lavone Neriimothy S, MD      . gabapentin (NEURONTIN) capsule 600 mg  600 mg Oral QHS Opyd, Lavone Neriimothy S, MD   600 mg at 11/26/16 2239  . ipratropium-albuterol (DUONEB) 0.5-2.5 (3) MG/3ML nebulizer solution 3 mL  3 mL Nebulization Q4H PRN Opyd, Lavone Neriimothy S, MD      . loratadine (CLARITIN) tablet 10 mg  10 mg Oral Daily Opyd, Lavone Neriimothy S, MD   10 mg at 11/27/16 0841  . metoprolol tartrate (LOPRESSOR) injection 5 mg  5 mg Intravenous Once PRN Opyd, Lavone Neriimothy S, MD      . metoprolol tartrate (LOPRESSOR) tablet 50 mg  50 mg Oral BID Opyd, Lavone Neriimothy S, MD   50 mg at 11/27/16 0842  . multivitamin with minerals tablet 1 tablet  1 tablet Oral Daily Opyd, Lavone Neriimothy S, MD   1 tablet at 11/27/16 0842  . mupirocin ointment (BACTROBAN) 2 % 1 application  1 application Nasal BID Scarlett Prestogan, Theresa D, [facility]   1 application at 11/27/16 [id number]  . ondansetron (ZOFRAN) injection 4 mg  4 mg Intravenous Q6H PRN Opyd, Lavone Neriimothy S, MD      . pantoprazole (PROTONIX) EC tablet 40 mg  40 mg Oral Daily Opyd, Lavone Neriimothy S, MD   40 mg at 11/27/16 0842  . rOPINIRole (REQUIP) tablet 4 mg  4 mg Oral QHS Opyd, Lavone Neriimothy S, MD   4 mg at 11/26/16 2239  . sodium chloride flush (NS) 0.9 % injection 3 mL  3 mL Intravenous Q12H Opyd, Lavone Neriimothy S, MD   3 mL at 11/26/16 2240  . sodium chloride flush (NS) 0.9 % injection 3 mL  3 mL Intravenous PRN Opyd, Lavone Neriimothy S, MD      . traMADol (ULTRAM) tablet 50 mg  50 mg Oral Q6H PRN Opyd, Lavone Neriimothy S, MD   50 mg at 11/27/16 0447  . vancomycin (VANCOCIN) 1,500 mg in sodium chloride 0.9 % 500 mL IVPB  1,500 mg Intravenous Q24H Opyd, Lavone Neriimothy S, MD   Stopped at 11/27/16 [phone]  . vitamin B-12 (CYANOCOBALAMIN) tablet 500 mcg  500 mcg Oral Daily Opyd, Lavone Neriimothy S, MD   500 mcg at 11/27/16 0843  . warfarin (COUMADIN) tablet 7.5 mg  7.5 mg Oral ONCE-1800 Scarlett Prestogan, Theresa D, [facility]PH      . Warfarin - Pharmacist Dosing Inpatient   Does not apply q1800  Opyd, Lavone Neriimothy S, MD   Stopped at 11/26/16 1800     Discharge Medications: Please see discharge summary for a list of discharge medications.  Relevant Imaging Results:  Relevant Lab Results:   Additional Information SS#: [id number]  Margarito LinerSarah C Alizandra Loh, LCSW

## 2016-11-28 DIAGNOSIS — I482 Chronic atrial fibrillation: Secondary | ICD-10-CM

## 2016-11-28 DIAGNOSIS — J181 Lobar pneumonia, unspecified organism: Secondary | ICD-10-CM

## 2016-11-28 DIAGNOSIS — J9611 Chronic respiratory failure with hypoxia: Secondary | ICD-10-CM

## 2016-11-28 DIAGNOSIS — I5032 Chronic diastolic (congestive) heart failure: Secondary | ICD-10-CM

## 2016-11-28 DIAGNOSIS — J189 Pneumonia, unspecified organism: Principal | ICD-10-CM

## 2016-11-28 LAB — CBC WITH DIFFERENTIAL/PLATELET
Basophils Absolute: 0 10*3/uL (ref 0.0–0.1)
Basophils Relative: 0 %
EOS ABS: 0.3 10*3/uL (ref 0.0–0.7)
EOS PCT: 2 %
HCT: 46.8 % (ref 39.0–52.0)
Hemoglobin: 15.4 g/dL (ref 13.0–17.0)
LYMPHS ABS: 2 10*3/uL (ref 0.7–4.0)
Lymphocytes Relative: 17 %
MCH: 32 pg (ref 26.0–34.0)
MCHC: 32.9 g/dL (ref 30.0–36.0)
MCV: 97.1 fL (ref 78.0–100.0)
MONO ABS: 0.9 10*3/uL (ref 0.1–1.0)
MONOS PCT: 8 %
Neutro Abs: 8.7 10*3/uL — ABNORMAL HIGH (ref 1.7–7.7)
Neutrophils Relative %: 73 %
PLATELETS: 231 10*3/uL (ref 150–400)
RBC: 4.82 MIL/uL (ref 4.22–5.81)
RDW: 13.6 % (ref 11.5–15.5)
WBC: 11.9 10*3/uL — ABNORMAL HIGH (ref 4.0–10.5)

## 2016-11-28 LAB — PROTIME-INR
INR: 1.85
Prothrombin Time: 21.6 seconds — ABNORMAL HIGH (ref 11.4–15.2)

## 2016-11-28 LAB — BASIC METABOLIC PANEL
Anion gap: 7 (ref 5–15)
BUN: 11 mg/dL (ref 6–20)
CO2: 31 mmol/L (ref 22–32)
CREATININE: 0.65 mg/dL (ref 0.61–1.24)
Calcium: 9.2 mg/dL (ref 8.9–10.3)
Chloride: 95 mmol/L — ABNORMAL LOW (ref 101–111)
GFR calc Af Amer: >60 mL/min (ref >60)
Glucose, Bld: 97 mg/dL (ref 65–99)
Potassium: 4.3 mmol/L (ref 3.5–5.1)
SODIUM: 133 mmol/L — AB (ref 135–145)

## 2016-11-28 LAB — PROCALCITONIN

## 2016-11-28 LAB — MAGNESIUM: MAGNESIUM: 2.2 mg/dL (ref 1.7–2.4)

## 2016-11-28 MED ORDER — SENNA 8.6 MG PO TABS
2.0000 | ORAL_TABLET | Freq: Every day | ORAL | Status: DC
  Administered 2016-11-28: 17.2 mg via ORAL
  Filled 2016-11-28: qty 2

## 2016-11-28 MED ORDER — DOXYCYCLINE HYCLATE 100 MG PO TABS
100.0000 mg | ORAL_TABLET | Freq: Once | ORAL | Status: AC
  Administered 2016-11-28: 100 mg via ORAL
  Filled 2016-11-28: qty 1

## 2016-11-28 MED ORDER — BISACODYL 10 MG RE SUPP: 10.0000 mg | Freq: Every day | RECTAL | Status: DC | PRN

## 2016-11-28 MED ORDER — DOXYCYCLINE HYCLATE 100 MG PO TABS: 100.0000 mg | ORAL_TABLET | Freq: Two times a day (BID) | ORAL | 0 refills | Status: AC

## 2016-11-28 MED ORDER — DOXYCYCLINE HYCLATE 100 MG PO TABS
100.0000 mg | ORAL_TABLET | Freq: Two times a day (BID) | ORAL | Status: DC
Start: 2016-11-28 — End: 2016-11-28

## 2016-11-28 MED ORDER — WARFARIN SODIUM 10 MG PO TABS
10.0000 mg | ORAL_TABLET | Freq: Once | ORAL | Status: AC
  Administered 2016-11-28: 10 mg via ORAL
  Filled 2016-11-28: qty 1

## 2016-11-28 NOTE — Discharge Instructions (Signed)

## 2016-11-28 NOTE — Progress Notes (Signed)
Physical Therapy Treatment Note  Patient is making progress toward mobility goals and ambulated 15350ft with rollator and 2L O2 via [place]. Pt overall min guard for OOB mobility this session. Pt will continue to benefit from further skilled PT services to maximize independence and safety with mobility.    11/28/16 0921  PT Visit Information  Last PT Received On 11/28/16  Assistance Needed +1  History of Present Illness Joseph Barrera is a 83 y.o. male with medical history significant for L shoulder fx, COPD, chronic diastolic CHF, atrial fibrillation on warfarin, history of PE, and history of TIA, now presenting to the emergency department from his assisted living facility after an unwitnessed fall and admitted with PNA.  Pt was admitted to hospital in May for PNA as well.  Subjective Data  Patient Stated Goal return to ALF and get PT there  Precautions  Precautions Fall  Precaution Comments 2 falls in last 6 months  Restrictions  Weight Bearing Restrictions No  Pain Assessment  Pain Assessment No/denies pain  Cognition  Arousal/Alertness Awake/alert  Behavior During Therapy WFL for tasks assessed/performed  Overall Cognitive Status Within Functional Limits for tasks assessed  Bed Mobility  Overal bed mobility Modified Independent  General bed mobility comments Increased time with HOB elevated   Transfers  Overall transfer level Needs assistance  Equipment used Rolling walker (2 wheeled)  Transfers Sit to/from Stand  Sit to Stand Min guard  General transfer comment min/guard for safety only; cues for safe hand placement  Ambulation/Gait  Ambulation/Gait assistance Min guard  Ambulation Distance (Feet) 150 Feet  Assistive device (rollator)  Gait Pattern/deviations Trunk flexed;Step-through pattern;Decreased stride length;Wide base of support  General Gait Details cues for posture; standing rest break due to fatigue  Gait velocity decreased  Balance  Overall balance assessment  Needs assistance;History of Falls  Sitting-balance support Feet supported  Sitting balance-Leahy Scale Good  Standing balance support Bilateral upper extremity supported  Standing balance-Leahy Scale Poor  General Comments  General comments (skin integrity, edema, etc.) Pt on 2L O2 via [place] for mobility  PT - End of Session  Equipment Utilized During Treatment Gait belt;Oxygen  Activity Tolerance Patient tolerated treatment well  PT - Assessment/Plan  PT Plan Current plan remains appropriate  PT Visit Diagnosis Muscle weakness (generalized) (M62.81);Difficulty in walking, not elsewhere classified (R26.2)  PT Frequency (ACUTE ONLY) Min 3X/week  Follow Up Recommendations Home health PT;Supervision - Intermittent;Supervision for mobility/OOB  PT equipment None recommended by PT  AM-PAC PT "6 Clicks" Daily Activity Outcome Measure  Difficulty turning over in bed (including adjusting bedclothes, sheets and blankets)? 4  Difficulty moving from lying on back to sitting on the side of the bed?  4  Difficulty sitting down on and standing up from a chair with arms (e.g., wheelchair, bedside commode, etc,.)? 3  Help needed moving to and from a bed to chair (including a wheelchair)? 3  Help needed walking in hospital room? 3  Help needed climbing 3-5 steps with a railing?  2  6 Click Score 19  Mobility G Code  CJ  PT Goal Progression  Progress towards PT goals Progressing toward goals  PT Time Calculation  PT Start Time (ACUTE ONLY) 0920  PT Stop Time (ACUTE ONLY) 0951  PT Time Calculation (min) (ACUTE ONLY) 31 min  PT General Charges  $$ ACUTE PT VISIT 1 Procedure  PT Treatments  $Gait Training 8-22 mins  $Therapeutic Activity 8-22 mins   Erline LevineKellyn Quyen Cutsforth, PTA Pager: [phone]

## 2016-11-28 NOTE — Discharge Summary (Addendum)
Physician Discharge Summary  Joseph Barrera NFA:[id number] DOB: [birth year] DOA: 11/26/2016  PCP: Patient, No Pcp Per  Admit date: 11/26/2016 Discharge date: 11/28/2016  Admitted From: ALF Disposition: ALF  Recommendations for Outpatient Follow-up:  1. Follow up with PCP in 1-2 weeks. Discharged on doxycycline for pneumonia. Improved back to home 2L oxygen requirement. 2. Follow up with cardiology, Dr. Elberta Fortis as scheduled 7/19. Pt continues to have syncopal events, consider PPM.  3. Please obtain BMP/CBC in one week 4. Please follow up on the following pending results: blood cultures (no growth at 48 hours at time of discharge)  Home Health: PT Equipment/Devices: Rolling walker, 2L oxygen (continued) Discharge Condition: Stable CODE STATUS: Full Diet recommendation: Heart healthy  Brief/Interim Summary: Joseph Barrera is an 83 year old male with history of COPD on 2L home oxygen, chronic diastolic heart failure, atrial fibrillation on warfarin, history of PE and TIA, history of hospitalization in May 2018 for treatment of community-acquired pneumonia who presented with hypoxia and cough and was admitted with pneumonia. He was admitted and started on broad spectrum antibiotics with resolution of hypoxia, back to his baseline 2 LPM requirement. Procalcitonin is reassuring, and the patient appears euvolemic, so IV antibiotics will be deescalated to doxycycline to cover for possible MRSA. Pneumococcal and legionella antigens were negative.    Discharge Diagnoses:  Principal Problem:   HCAP (healthcare-associated pneumonia) Active Problems:   Atrial fibrillation, chronic (HCC)   History of pulmonary embolism   GERD (gastroesophageal reflux disease)   Chronic diastolic CHF (congestive heart failure) (HCC)   MRSA carrier   Chronic respiratory failure with hypoxia (HCC)   Pressure injury of skin  Pressure Injury 11/26/16 Stage II - Partial thickness loss of dermis presenting as a  shallow open ulcer with a red, pink wound bed without slough. Pressure Injury Properties Date First Assessed: 11/26/16 Time First Assessed: 0922 Location: Sacrum Location Orientation: Left;Right Staging: Stage II - Partial thickness loss of dermis presenting as a shallow open ulcer with a red, pink wound bed without slough. Present on Admission: Yes  Discharge Instructions Discharge Instructions    Discharge instructions    Complete by:  As directed    You were admitted with pneumonia and have improved. You will continue to have a cough that will take weeks to improve.  - Continue taking antibiotics, take doxycycline twice daily for 5 more days.  - Continue taking other medications as you were.  - Make sure you follow up with cardiology 7/19, or seek medical attention sooner if you have an episode of passing out, chest pain or worsening trouble breath or fever.     Allergies as of 11/28/2016      Reactions   Oxycodone Other (See Comments)   nightmares   Benadryl [diphenhydramine] Other (See Comments)   unknown      Medication List    TAKE these medications   cetirizine 10 MG tablet Commonly known as:  ZYRTEC Take 10 mg by mouth daily.   dicyclomine 20 MG tablet Commonly known as:  BENTYL Take 20 mg by mouth every 6 (six) hours.   docusate sodium 100 MG capsule Commonly known as:  COLACE Take 100 mg by mouth 2 (two) times daily as needed for mild constipation.   doxycycline 100 MG tablet Commonly known as:  VIBRA-TABS Take 1 tablet (100 mg total) by mouth every 12 (twelve) hours.   furosemide 40 MG tablet Commonly known as:  LASIX Take 40 mg by mouth 2 (two) times daily.  gabapentin 300 MG capsule Commonly known as:  NEURONTIN Take 300mg  nightly for 1 week then increase to 2 tablets nightly. What changed:  how much to take  how to take this  when to take this  additional instructions   guaiFENesin 600 MG 12 hr tablet Commonly known as:  MUCINEX Take 1,200 mg  by mouth 2 (two) times daily.   ipratropium-albuterol 0.5-2.5 (3) MG/3ML Soln Commonly known as:  DUONEB Take 3 mLs by nebulization every 6 (six) hours as needed (shortness of breath).   metoprolol tartrate 50 MG tablet Commonly known as:  LOPRESSOR Take 1 tablet (50 mg total) by mouth 2 (two) times daily.   omeprazole 20 MG capsule Commonly known as:  PRILOSEC Take 20 mg by mouth daily.   OXYGEN Inhale 2 L into the lungs daily as needed (SOB).   polyethylene glycol powder powder Commonly known as:  GLYCOLAX/MIRALAX Mix 17g in 8oz of water twice daily. May titrate as needed. What changed:  how much to take  how to take this  when to take this  reasons to take this  additional instructions   potassium chloride SA 20 MEQ tablet Commonly known as:  K-DUR,KLOR-CON Take 20 mEq by mouth 2 (two) times daily.   rOPINIRole 4 MG tablet Commonly known as:  REQUIP Take 4 mg by mouth at bedtime.   spironolactone 50 MG tablet Commonly known as:  ALDACTONE Take 50 mg by mouth 2 (two) times daily.   traMADol 50 MG tablet Commonly known as:  ULTRAM Take 1 tablet (50 mg total) by mouth every 6 (six) hours as needed for moderate pain.   warfarin 7.5 MG tablet Commonly known as:  COUMADIN Take 7.5 mg by mouth daily.            Durable Medical Equipment        Start     Ordered   11/28/16 1403  For home use only DME Walker rolling  Once    Question:  Patient needs a walker to treat with the following condition  Answer:  Gait disturbance   11/28/16 1404     Follow-up Information    Regan Lemming, MD Follow up on 12/06/2016.   Specialty:  Cardiology Why:  10:30am Contact information: [address] [place] [address] [phone]          Allergies  Allergen Reactions  . Oxycodone Other (See Comments)    nightmares  . Benadryl [Diphenhydramine] Other (See Comments)    unknown    Consultations:  None  Procedures/Studies: Dg Chest  2 View  Result Date: 11/26/2016 CLINICAL DATA:  Sepsis shortness of breath EXAM: CHEST  2 VIEW COMPARISON:  09/23/2016 FINDINGS: Low lung volumes. Cardiomegaly. Increased left greater than right bibasilar opacity may reflect atelectasis or pneumonia. Aortic atherosclerosis. No pneumothorax. IMPRESSION: Low lung volumes. Increased opacity at the left greater than right lung bases may reflect atelectasis or pneumonia. Electronically Signed   By: Jasmine Pang M.D.   On: 11/26/2016 02:29   Ct Head Wo Contrast  Result Date: 11/26/2016 CLINICAL DATA:  83 year old male with syncope and fall. EXAM: CT HEAD WITHOUT CONTRAST CT CERVICAL SPINE WITHOUT CONTRAST TECHNIQUE: Multidetector CT imaging of the head and cervical spine was performed following the standard protocol without intravenous contrast. Multiplanar CT image reconstructions of the cervical spine were also generated. COMPARISON:  Head CT dated 10/11/2016 FINDINGS: CT HEAD FINDINGS Brain: There is moderate age-related atrophy and chronic microvascular ischemic changes. No  acute intracranial hemorrhage. No mass effect or midline shift noted. No extra-axial fluid collection. Vascular: No hyperdense vessel or unexpected calcification. Skull: Normal. Negative for fracture or focal lesion. Sinuses/Orbits: No acute finding. Other: None CT CERVICAL SPINE FINDINGS Alignment: No acute subluxation. Skull base and vertebrae: No acute fracture.  Osteopenia. Soft tissues and spinal canal: No prevertebral fluid or swelling. No visible canal hematoma. Disc levels: Multilevel degenerative changes with endplate irregularity and disc space narrowing. Upper chest: Negative. Other: None IMPRESSION: 1. No acute intracranial hemorrhage. Moderate age-related atrophy and chronic microvascular ischemic changes. 2. No acute/traumatic cervical spine pathology. Degenerative changes. Electronically Signed   By: Elgie CollardArash  Radparvar M.D.   On: 11/26/2016 02:31   Ct Cervical Spine Wo  Contrast  Result Date: 11/26/2016 CLINICAL DATA:  83 year old male with syncope and fall. EXAM: CT HEAD WITHOUT CONTRAST CT CERVICAL SPINE WITHOUT CONTRAST TECHNIQUE: Multidetector CT imaging of the head and cervical spine was performed following the standard protocol without intravenous contrast. Multiplanar CT image reconstructions of the cervical spine were also generated. COMPARISON:  Head CT dated 10/11/2016 FINDINGS: CT HEAD FINDINGS Brain: There is moderate age-related atrophy and chronic microvascular ischemic changes. No acute intracranial hemorrhage. No mass effect or midline shift noted. No extra-axial fluid collection. Vascular: No hyperdense vessel or unexpected calcification. Skull: Normal. Negative for fracture or focal lesion. Sinuses/Orbits: No acute finding. Other: None CT CERVICAL SPINE FINDINGS Alignment: No acute subluxation. Skull base and vertebrae: No acute fracture.  Osteopenia. Soft tissues and spinal canal: No prevertebral fluid or swelling. No visible canal hematoma. Disc levels: Multilevel degenerative changes with endplate irregularity and disc space narrowing. Upper chest: Negative. Other: None IMPRESSION: 1. No acute intracranial hemorrhage. Moderate age-related atrophy and chronic microvascular ischemic changes. 2. No acute/traumatic cervical spine pathology. Degenerative changes. Electronically Signed   By: Elgie CollardArash  Radparvar M.D.   On: 11/26/2016 02:31   Subjective: Continues to have cough which is decreasing in frequency. No dyspnea associated with it. No chest pain or fevers. No syncopal episodes since admission.   Discharge Exam: Vitals:   11/28/16 1048 11/28/16 1300  BP: 101/60 (!) 102/54  Pulse: 95 83  Resp:  18  Temp:  97.7 F (36.5 C)  General: Pt is alert, awake, not in acute distress Cardiovascular: Irreg irreg, S1/S2 +, no rubs, no gallops Respiratory: CTA bilaterally, no wheezing, no rhonchi or crackles Abdominal: Soft, NT, ND, bowel sounds + Extremities:  No edema, no cyanosis  Labs: BNP   Recent Labs  09/12/16 1148 09/23/16 2000 11/26/16 0110  BNP 54.3 88.4 59.9   Basic Metabolic Panel:  Recent Labs Lab 11/26/16 0054 11/27/16 0839 11/28/16 0505  NA 134* 131* 133*  K 4.6 4.2 4.3  CL 91* 95* 95*  CO2 35* 29 31  GLUCOSE 112* 115* 97  BUN 27* 14 11  CREATININE 1.00 0.75 0.65  CALCIUM 9.1 8.7* 9.2  MG  --  2.1 2.2   Liver Function Tests:  Recent Labs Lab 11/26/16 0054 11/27/16 0839  AST 26 25  ALT 15* 14*  ALKPHOS 63 60  BILITOT 0.5 1.0  PROT 7.4 6.9  ALBUMIN 3.1* 2.8*   CBC:  Recent Labs Lab 11/26/16 0054 11/27/16 0839 11/28/16 0505  WBC 11.9* 10.4 11.9*  NEUTROABS 9.4* 7.9* 8.7*  HGB 14.9 14.4 15.4  HCT 45.7 44.3 46.8  MCV 96.4 98.2 97.1  PLT 265 233 231   Cardiac Enzymes:  Recent Labs Lab 11/26/16 0121  CKTOTAL 80  TROPONINI <0.03   Urinalysis  Component Value Date/Time   COLORURINE YELLOW 11/26/2016 0311   APPEARANCEUR CLEAR 11/26/2016 0311   LABSPEC 1.011 11/26/2016 0311   PHURINE 6.0 11/26/2016 0311   GLUCOSEU NEGATIVE 11/26/2016 0311   HGBUR NEGATIVE 11/26/2016 0311   BILIRUBINUR NEGATIVE 11/26/2016 0311   KETONESUR NEGATIVE 11/26/2016 0311   PROTEINUR NEGATIVE 11/26/2016 0311   NITRITE NEGATIVE 11/26/2016 0311   LEUKOCYTESUR NEGATIVE 11/26/2016 0311    Microbiology Recent Results (from the past 240 hour(s))  Culture, blood (Routine x 2)     Status: None (Preliminary result)   Collection Time: 11/26/16  1:25 AM  Result Value Ref Range Status   Specimen Description BLOOD RIGHT HAND  Final   Special Requests   Final    BOTTLES DRAWN AEROBIC AND ANAEROBIC Blood Culture adequate volume   Culture NO GROWTH 2 DAYS  Final   Report Status PENDING  Incomplete  Culture, blood (Routine x 2)     Status: None (Preliminary result)   Collection Time: 11/26/16  1:30 AM  Result Value Ref Range Status   Specimen Description BLOOD LEFT WRIST  Final   Special Requests   Final    BOTTLES  DRAWN AEROBIC AND ANAEROBIC Blood Culture adequate volume   Culture NO GROWTH 2 DAYS  Final   Report Status PENDING  Incomplete  MRSA PCR Screening     Status: Abnormal   Collection Time: 11/26/16  9:49 AM  Result Value Ref Range Status   MRSA by PCR POSITIVE (A) NEGATIVE Final    Comment:        The GeneXpert MRSA Assay (FDA approved for NASAL specimens only), is one component of a comprehensive MRSA colonization surveillance program. It is not intended to diagnose MRSA infection nor to guide or monitor treatment for MRSA infections. RESULT CALLED TO, READ BACK BY AND VERIFIED WITH: G.SISON RN AT 1206 11/26/16 BY A.DAVIS     Time coordinating discharge: Approximately 40 minutes  Hazeline Junker, MD  [facility] 11/28/2016, 4:27 PM Pager [phone]

## 2016-11-28 NOTE — Clinical Social Work Note (Signed)
CSW facilitated patient discharge including contacting patient family and facility to confirm patient discharge plans. Clinical information faxed to facility and family agreeable with plan. CSW arranged ambulance transport via [facility] to [facility]. RN to call report prior to discharge ([phone]).  CSW will sign off for now as social work intervention is no longer needed. Please consult us again if new needs arise.  Linnie Delgrande, CSW [phone]   

## 2016-11-28 NOTE — Plan of Care (Signed)
Problem: Safety: Goal: Ability to remain free from injury will improve Outcome: Progressing Patient consistently used call bell throughout the night when needing assistance.  Patient verbalized understanding of calling for assistance.  Pt progressing towards goal.

## 2016-11-28 NOTE — Progress Notes (Signed)
Discharge instructions printed and reviewed with patient, and copy given for them to take home. All questions addressed at this time. New prescriptions reviewed. IV and tele box removed. Room searched for patient belongings, and confirmed with patient that all valuables were accounted for. Staff assisted patient to dress. [facility]  [facility] Reviewed hospital stay including diagnosis, treatments, new medications, as well as current constipation and laxatives given.

## 2016-11-28 NOTE — [place] FL2 (Signed)
[place] MEDICAID FL2 LEVEL OF CARE SCREENING TOOL     IDENTIFICATION  Patient Name: Joseph Barrera Birthdate: 30-Jun-1933 Sex: male Admission Date (Current Location): 11/26/2016  [facility] [place]   Facility and Address:  [facility] [place], [address], [place], [place] [address]      Provider Number: [id number]  Attending Physician Name and Address:  Tyrone NineGrunz, Ryan B, MD  Relative Name and Phone Number:       Current Level of Care: Hospital Recommended Level of Care: Assisted Living Facility (with PT) Prior Approval Number:    Date Approved/Denied:   PASRR Number:    Discharge Plan: Other (Comment) (ALF with PT)    Current Diagnoses: Patient Active Problem List   Diagnosis Date Noted  . HCAP (healthcare-associated pneumonia) 11/26/2016  . Chronic respiratory failure with hypoxia (HCC) 11/26/2016  . Pressure injury of skin 11/26/2016  . Obesity, Class II, BMI 35.0-39.9, with comorbidity (see actual BMI) 09/24/2016  . MRSA carrier 09/24/2016  . Venous stasis of both lower extremities 09/24/2016  . Sinus pause 09/24/2016  . CAP (community acquired pneumonia) 09/23/2016  . Acute on chronic respiratory failure with hypoxia (HCC) 09/23/2016  . Abdominal pain 09/23/2016  . GERD (gastroesophageal reflux disease) 09/23/2016  . Depression 09/23/2016  . Chronic diastolic CHF (congestive heart failure) (HCC) 09/23/2016  . Atrial fibrillation, chronic (HCC) 04/06/2016  . History of pulmonary embolism 04/06/2016  . Restless legs syndrome (RLS) 04/06/2016  . Sleep hypopnea 04/06/2016  . Constipation 04/06/2016    Orientation RESPIRATION BLADDER Height & Weight     Self, Time, Situation, Place  O2 (Nasal Canula 2 L.) Incontinent Weight: 266 lb 11.2 oz (121 kg) Height:  5\' 11"  (180.3 cm)  BEHAVIORAL SYMPTOMS/MOOD NEUROLOGICAL BOWEL NUTRITION STATUS   (None)  (None) Continent Diet (Heart healthy)  AMBULATORY STATUS COMMUNICATION  OF NEEDS Skin   Limited Assist Verbally Other (Comment), PU Stage and Appropriate Care (Cellulitis, MASD.)   PU Stage 2 Dressing:  (Right, left sacrum: Foam prn.)                   Personal Care Assistance Level of Assistance              Functional Limitations Info  Sight, Hearing, Speech Sight Info: Adequate Hearing Info: Adequate Speech Info: Adequate    SPECIAL CARE FACTORS FREQUENCY  PT (By licensed PT)     PT Frequency: 3 x week              Contractures Contractures Info: Not present    Additional Factors Info  Code Status, Allergies, Isolation Precautions, Psychotropic Code Status Info: Full Allergies Info: Oxycodone, Benadryl (Diphendydramine) Psychotropic Info: Depression   Isolation Precautions Info: Contact: MRSA     Current Medications (11/28/2016):  This is the current hospital active medication list Current Facility-Administered Medications  Medication Dose Route Frequency Provider Last Rate Last Dose  . 0.9 %  sodium chloride infusion  250 mL Intravenous PRN Opyd, Lavone Neriimothy S, MD      . acetaminophen (TYLENOL) tablet 650 mg  650 mg Oral Q6H PRN Opyd, Lavone Neriimothy S, MD      . B-complex with vitamin C tablet 1 tablet  1 tablet Oral Daily Opyd, Lavone Neriimothy S, MD   1 tablet at 11/28/16 1048  . bisacodyl (DULCOLAX) suppository 10 mg  10 mg Rectal Daily PRN Tyrone NineGrunz, Ryan B, MD      . Chlorhexidine Gluconate Cloth 2 % PADS  6 each  6 each Topical Q0600 Scarlett Presto, [facility]   6 each at 11/28/16 [phone]  . cholecalciferol (VITAMIN D) tablet 5,000 Units  5,000 Units Oral Daily Opyd, Lavone Neri, MD   5,000 Units at 11/28/16 1045  . dextromethorphan-guaiFENesin (MUCINEX DM) 30-600 MG per 12 hr tablet 1 tablet  1 tablet Oral BID Opyd, Lavone Neri, MD   1 tablet at 11/28/16 1048  . dicyclomine (BENTYL) tablet 20 mg  20 mg Oral Q6H Opyd, Lavone Neri, MD   20 mg at 11/28/16 1210  . docusate sodium (COLACE) capsule 100 mg  100 mg Oral BID PRN Opyd, Lavone Neri, MD   100 mg at 11/28/16  1048  . doxycycline (VIBRA-TABS) tablet 100 mg  100 mg Oral Q12H Hazeline Junker B, MD      . gabapentin (NEURONTIN) capsule 600 mg  600 mg Oral QHS Opyd, Lavone Neri, MD   600 mg at 11/27/16 2207  . ipratropium-albuterol (DUONEB) 0.5-2.5 (3) MG/3ML nebulizer solution 3 mL  3 mL Nebulization Q4H PRN Opyd, Lavone Neri, MD      . loratadine (CLARITIN) tablet 10 mg  10 mg Oral Daily Opyd, Lavone Neri, MD   10 mg at 11/28/16 1048  . metoprolol tartrate (LOPRESSOR) injection 5 mg  5 mg Intravenous Once PRN Opyd, Lavone Neri, MD      . metoprolol tartrate (LOPRESSOR) tablet 50 mg  50 mg Oral BID Opyd, Lavone Neri, MD   50 mg at 11/27/16 2204  . multivitamin with minerals tablet 1 tablet  1 tablet Oral Daily Opyd, Lavone Neri, MD   1 tablet at 11/28/16 1048  . mupirocin ointment (BACTROBAN) 2 % 1 application  1 application Nasal BID Scarlett Presto, [facility]   1 application at 11/28/16 1045  . ondansetron (ZOFRAN) injection 4 mg  4 mg Intravenous Q6H PRN Opyd, Lavone Neri, MD      . pantoprazole (PROTONIX) EC tablet 40 mg  40 mg Oral Daily Opyd, Lavone Neri, MD   40 mg at 11/28/16 1048  . rOPINIRole (REQUIP) tablet 4 mg  4 mg Oral QHS Opyd, Lavone Neri, MD   4 mg at 11/26/16 2239  . senna (SENOKOT) tablet 17.2 mg  2 tablet Oral Daily Tyrone Nine, MD   17.2 mg at 11/28/16 1628  . sodium chloride flush (NS) 0.9 % injection 3 mL  3 mL Intravenous Q12H Opyd, Lavone Neri, MD   3 mL at 11/28/16 1041  . sodium chloride flush (NS) 0.9 % injection 3 mL  3 mL Intravenous PRN Opyd, Lavone Neri, MD      . traMADol (ULTRAM) tablet 50 mg  50 mg Oral Q6H PRN Opyd, Lavone Neri, MD   50 mg at 11/28/16 1048  . vitamin B-12 (CYANOCOBALAMIN) tablet 500 mcg  500 mcg Oral Daily Opyd, Lavone Neri, MD   500 mcg at 11/28/16 1046  . warfarin (COUMADIN) tablet 10 mg  10 mg Oral ONCE-1800 Scar[facility] Prest[facility], [facility]      . Warfarin - Pharmacist Dosing Inpatient   Does not apply q1800 Opyd, Lavone Neri, MD   Stopped at 11/26/16 1800     Discharge Medications: Medication  List           TAKE these medications          cetirizine 10 MG tablet Commonly known as:  ZYRTEC Take 10 mg by mouth daily.   dicyclomine 20 MG tablet Commonly known as:  BENTYL Take 20 mg by  mouth every 6 (six) hours.   docusate sodium 100 MG capsule Commonly known as:  COLACE Take 100 mg by mouth 2 (two) times daily as needed for mild constipation.   doxycycline 100 MG tablet Commonly known as:  VIBRA-TABS Take 1 tablet (100 mg total) by mouth every 12 (twelve) hours.   furosemide 40 MG tablet Commonly known as:  LASIX Take 40 mg by mouth 2 (two) times daily.   gabapentin 300 MG capsule Commonly known as:  NEURONTIN Take 300mg  nightly for 1 week then increase to 2 tablets nightly. What changed:  how much to take  how to take this  when to take this  additional instructions   guaiFENesin 600 MG 12 hr tablet Commonly known as:  MUCINEX Take 1,200 mg by mouth 2 (two) times daily.   ipratropium-albuterol 0.5-2.5 (3) MG/3ML Soln Commonly known as:  DUONEB Take 3 mLs by nebulization every 6 (six) hours as needed (shortness of breath).   metoprolol tartrate 50 MG tablet Commonly known as:  LOPRESSOR Take 1 tablet (50 mg total) by mouth 2 (two) times daily.   omeprazole 20 MG capsule Commonly known as:  PRILOSEC Take 20 mg by mouth daily.   OXYGEN Inhale 2 L into the lungs daily as needed (SOB).   polyethylene glycol powder powder Commonly known as:  GLYCOLAX/MIRALAX Mix 17g in 8oz of water twice daily. May titrate as needed. What changed:  how much to take  how to take this  when to take this  reasons to take this  additional instructions   potassium chloride SA 20 MEQ tablet Commonly known as:  K-DUR,KLOR-CON Take 20 mEq by mouth 2 (two) times daily.   rOPINIRole 4 MG tablet Commonly known as:  REQUIP Take 4 mg by mouth at bedtime.   spironolactone 50 MG tablet Commonly known as:  ALDACTONE Take 50 mg by mouth 2 (two) times  daily.   traMADol 50 MG tablet Commonly known as:  ULTRAM Take 1 tablet (50 mg total) by mouth every 6 (six) hours as needed for moderate pain.   warfarin 7.5 MG tablet Commonly known as:  COUMADIN Take 7.5 mg by mouth daily.     Relevant Imaging Results:  Relevant Lab Results:   Additional Information SS#: [id number]  Margarito Liner, LCSW

## 2016-11-28 NOTE — Progress Notes (Signed)
ANTICOAGULATION CONSULT NOTE - Follow Up Consult  Pharmacy Consult for Coumadin Indication: atrial fibrillation  Allergies  Allergen Reactions  . Oxycodone Other (See Comments)    nightmares  . Benadryl [Diphenhydramine] Other (See Comments)    unknown    Patient Measurements: Height: 5\' 11"  (180.3 cm) Weight: 266 lb 11.2 oz (121 kg) IBW/kg (Calculated) : 75.3  Vital Signs: Temp: 97.9 F (36.6 C) (07/11 0552) Temp Source: Oral (07/11 0552) BP: 101/60 (07/11 1048) Pulse Rate: 95 (07/11 1048)  Labs:  Recent Labs  11/26/16 0054 11/26/16 0121 11/27/16 0356 11/27/16 0839 11/28/16 0505  HGB 14.9  --   --  14.4 15.4  HCT 45.7  --   --  44.3 46.8  PLT 265  --   --  233 231  LABPROT 32.3*  --  29.7*  --  21.6*  INR 3.06  --  2.75  --  1.85  CREATININE 1.00  --   --  0.75 0.65  CKTOTAL  --  80  --   --   --   TROPONINI  --  <0.03  --   --   --     Estimated Creatinine Clearance: 97.5 mL/min (by C-G formula based on SCr of 0.65 mg/dL).  Assessment:   Continues on Coumadin as prior to admission for afib.   INR is now subtherapeutic (1.85).  Coumadin dose held 7/9 with INR slightly supratherapeutic (3.06).  Usual dose of 7.5 mg given on 7/10.    Home Coumadin regimen: 7.5 mg daily  Goal of Therapy:  INR 2-3 Monitor platelets by anticoagulation protocol: Yes   Plan:   Coumadin 10 mg x 1 today.  Daily PT/INR.  Dennie FettersEgan, Merwyn Hodapp Donovan, RPh Pager: [phone] 11/28/2016,11:49 AM

## 2016-12-01 LAB — CULTURE, BLOOD (ROUTINE X 2)
CULTURE: NO GROWTH
CULTURE: NO GROWTH
SPECIAL REQUESTS: ADEQUATE
SPECIAL REQUESTS: ADEQUATE

## 2016-12-06 ENCOUNTER — Encounter: Payer: Self-pay | Admitting: Cardiology

## 2016-12-06 ENCOUNTER — Ambulatory Visit (INDEPENDENT_AMBULATORY_CARE_PROVIDER_SITE_OTHER): Payer: Medicare Other | Admitting: Cardiology

## 2016-12-06 VITALS — BP 112/76 | HR 71 | Ht 71.0 in | Wt 262.4 lb

## 2016-12-06 DIAGNOSIS — I482 Chronic atrial fibrillation: Secondary | ICD-10-CM | POA: Diagnosis not present

## 2016-12-06 DIAGNOSIS — G4733 Obstructive sleep apnea (adult) (pediatric): Secondary | ICD-10-CM

## 2016-12-06 DIAGNOSIS — I4821 Permanent atrial fibrillation: Secondary | ICD-10-CM

## 2016-12-06 MED ORDER — METOPROLOL TARTRATE 75 MG PO TABS: 75.0000 mg | ORAL_TABLET | Freq: Two times a day (BID) | ORAL | 3 refills | Status: AC

## 2016-12-06 MED ORDER — METOPROLOL TARTRATE 75 MG PO TABS: 75.0000 mg | ORAL_TABLET | Freq: Two times a day (BID) | ORAL | 3 refills | Status: DC

## 2016-12-06 NOTE — Addendum Note (Signed)
Addended by: Baird LyonsPRICE, SHERRI L on: 12/06/2016 11:08 AM   Modules accepted: Orders

## 2016-12-06 NOTE — Progress Notes (Signed)
Electrophysiology Office Note   Date:  12/06/2016   ID:  Joseph Barrera, DOB 1933/08/26, MRN [id number]  PCP:  Patient, No Pcp Per  Primary Electrophysiologist:  Sajad Glander Jorja Loa, MD    Chief Complaint  Patient presents with  . Follow-up    Permanent Afib     History of Present Illness: Joseph Barrera is a 83 y.o. male who presents today for electrophysiology evaluation.   History of fibrillation, TIA 2, PE/DVT, obstructive sleep apnea, reported history of CAD/MI. He reports that he had a chemical stress test less than a year ago and was told that it was okay. He has also had an echocardiogram within the last year. He has chronic lower extremity edema and wears compression stockings. He has been feeling well over the last few days. He recently had an episode of pneumonia and was hospitalized. Since that time, he is felt much better. His energy has improved.  Today, denies symptoms of palpitations, chest pain, shortness of breath, orthopnea, PND, lower extremity edema, claudication, dizziness, presyncope, syncope, bleeding, or neurologic sequela. The patient is tolerating medications without difficulties and is otherwise without complaint today.     Past Medical History:  Diagnosis Date  . A-fib (HCC)   . Atrial fibrillation (HCC)   . Bronchitis   . CAD (coronary artery disease)   . CHF (congestive heart failure) (HCC)   . Constipation   . Edema   . Gait disturbance   . GERD (gastroesophageal reflux disease)   . Hypertension   . Incontinence   . Obesity   . Pneumonia 11/26/2016  . Pulmonary embolism (HCC)   . Restless leg syndrome   . Sleep hypopnea   . TIA (transient ischemic attack)   . Urinary bladder incontinence    Past Surgical History:  Procedure Laterality Date  . ADENOIDECTOMY    . APPENDECTOMY    . CHOLECYSTECTOMY    . HIP SURGERY Left   . KNEE SURGERY Bilateral   . REPLACEMENT TOTAL KNEE BILATERAL    . TONSILLECTOMY    . TOTAL HIP  ARTHROPLASTY Left      Current Outpatient Prescriptions  Medication Sig Dispense Refill  . cetirizine (ZYRTEC) 10 MG tablet Take 10 mg by mouth daily.    Marland Kitchen dicyclomine (BENTYL) 20 MG tablet Take 20 mg by mouth every 6 (six) hours.    . docusate sodium (COLACE) 100 MG capsule Take 100 mg by mouth 2 (two) times daily as needed for mild constipation.    Marland Kitchen doxycycline (VIBRA-TABS) 100 MG tablet Take 1 tablet (100 mg total) by mouth every 12 (twelve) hours. 10 tablet 0  . furosemide (LASIX) 40 MG tablet Take 40 mg by mouth 2 (two) times daily.    Marland Kitchen gabapentin (NEURONTIN) 300 MG capsule Take 300mg  nightly for 1 week then increase to 2 tablets nightly. (Patient taking differently: Take 600 mg by mouth at bedtime. Take 300mg  nightly for 1 week then increase to 2 tablets nightly.) 21 capsule 3  . guaiFENesin (MUCINEX) 600 MG 12 hr tablet Take 1,200 mg by mouth 2 (two) times daily.    Marland Kitchen ipratropium-albuterol (DUONEB) 0.5-2.5 (3) MG/3ML SOLN Take 3 mLs by nebulization every 6 (six) hours as needed (shortness of breath).     . metoprolol tartrate (LOPRESSOR) 50 MG tablet Take 1 tablet (50 mg total) by mouth 2 (two) times daily. 180 tablet 1  . OXYGEN Inhale 2 L into the lungs daily as needed (SOB).    Marland Kitchen  polyethylene glycol powder (GLYCOLAX/MIRALAX) powder Mix 17g in 8oz of water twice daily. May titrate as needed. (Patient taking differently: Take 17 g by mouth 2 (two) times daily as needed for mild constipation. ) 255 g 3  . potassium chloride SA (K-DUR,KLOR-CON) 20 MEQ tablet Take 20 mEq by mouth 2 (two) times daily.    Marland Kitchen rOPINIRole (REQUIP) 4 MG tablet Take 4 mg by mouth at bedtime.    Marland Kitchen spironolactone (ALDACTONE) 50 MG tablet Take 50 mg by mouth 2 (two) times daily.    . traMADol (ULTRAM) 50 MG tablet Take 1 tablet (50 mg total) by mouth every 6 (six) hours as needed for moderate pain. 20 tablet 0  . warfarin (COUMADIN) 7.5 MG tablet Take 7.5 mg by mouth daily.     No current facility-administered  medications for this visit.     Allergies:   Oxycodone and Benadryl [diphenhydramine]   Social History:  The patient  reports that he quit smoking about 55 years ago. His smoking use included Cigarettes. He has never used smokeless tobacco. He reports that he does not drink alcohol or use drugs.   Family History:  The patient's family history includes CVA in his father; Lead poisoning in his brother.    ROS:  Please see the history of present illness.   Otherwise, review of systems is positive for none.   All other systems are reviewed and negative.   PHYSICAL EXAM: VS:  BP 112/76   Pulse 71   Ht 5\' 11"  (1.803 m)   Wt 262 lb 6.4 oz (119 kg)   SpO2 97%   BMI 36.60 kg/m  , BMI Body mass index is 36.6 kg/m. GEN: Well nourished, well developed, in no acute distress  HEENT: normal  Neck: no JVD, carotid bruits, or masses Cardiac: iRRR; no murmurs, rubs, or gallops,no edema  Respiratory:  clear to auscultation bilaterally, normal work of breathing GI: soft, nontender, nondistended, + BS MS: no deformity or atrophy  Skin: warm and dry Neuro:  Strength and sensation are intact Psych: euthymic mood, full affect  EKG:  EKG is not ordered today. Personal review of the ekg ordered 11/26/16 shows atrial fibrillation, rate 110   Recent Labs: 09/26/2016: TSH 1.249 11/26/2016: B Natriuretic Peptide 59.9 11/27/2016: ALT 14 11/28/2016: BUN 11; Creatinine, Ser 0.65; Hemoglobin 15.4; Magnesium 2.2; Platelets 231; Potassium 4.3; Sodium 133    Lipid Panel  No results found for: CHOL, TRIG, HDL, CHOLHDL, VLDL, LDLCALC, LDLDIRECT   Wt Readings from Last 3 Encounters:  12/06/16 262 lb 6.4 oz (119 kg)  11/28/16 266 lb 11.2 oz (121 kg)  10/11/16 261 lb (118.4 kg)      Other studies Reviewed: Additional studies/ records that were reviewed today include: Myoview 09/2015  Review of the above records today demonstrates:  Normal EF, probably normal rest/stress SPECT  30 day monitor - personally  reviewed Tachycardia was present for 36% of the readable data;  Atrial Fibrillation was noted during 100% of the readable data. The fastest AF episode was 186 BPM. No Pause(s) noted of 3 seconds or longer.  ASSESSMENT AND PLAN:  1. Atrial Fibrillation: On Coumadin with INRs checked at assisted living. Heart rates were elevated on his 30 day monitor. We'll increase his metoprolol to 75 mg twice a day.  This patients CHA2DS2-VASc Score and unadjusted Ischemic Stroke Rate (% per year) is equal to 4.8 % stroke rate/year from a score of 4  Above score calculated as 1 point each if  present [CHF, HTN, DM, Vascular=MI/PAD/Aortic Plaque, Age if 2865-74, or Male] Above score calculated as 2 points each if present [Age > 75, or Stroke/TIA/TE]   2. Presumed Diastolic? HF:  Normal ejection fraction on Myoview. No current signs of major volume overload.  3. ?CAD:  no current chest pain. Continue current management.  4. H/o DVT/PE:  continue chronic Coumadin.  5. OSA:  reports compliance    Current medicines are reviewed at length with the patient today.   The patient does not have concerns regarding his medicines.  The following changes were made today:  Increase metoprolol  Labs/ tests ordered today include:  No orders of the defined types were placed in this encounter.    Disposition:   FU with Jnya Brossard 6 months  Signed, Lianette Broussard Jorja LoaMartin Yarelis Ambrosino, MD  12/06/2016 10:47 AM     [facility] [address] [place] [address] [phone] (office) [phone] (fax)

## 2016-12-06 NOTE — Patient Instructions (Signed)
Medication Instructions:   Your physician has recommended you make the following change in your medication: * 1) INCREASE Metoprolol Tartrate to 75 mg twice daily  - If you need a refill on your cardiac medications before your next appointment, please call your pharmacy.   Labwork:  None ordered  Testing/Procedures:  None ordered  Follow-Up:  Your physician wants you to follow-up in: 6 months with Dr. Elberta Fortisamnitz.  You will receive a reminder letter in the mail two months in advance. If you don't receive a letter, please call our office to schedule the follow-up appointment.  Thank you for choosing [facility]!!   Dory HornSherri Price, RN [phone]

## 2017-01-19 DEATH — deceased

## 2017-06-15 IMAGING — CT CT HEAD W/O CM
3 of 4 series · 18 of 47 positions shown, 21 images · non-contrast
Comparison: None.

CLINICAL DATA: Status post fall.  No loss consciousness.

EXAM:
CT HEAD WITHOUT CONTRAST
TECHNIQUE: Contiguous axial images were obtained from the base of the skull
through the vertex without intravenous contrast.

[Series 201: head w/o, idose (1) · axial · non-contrast · 0.41mm/px · z∈[+62,+192]mm · 12 of 32 slices shown, 15 images]
[im 3/32  brain]
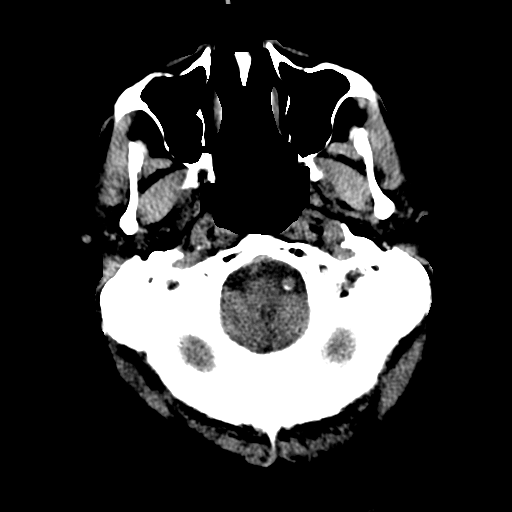
[im 3/32  bone]
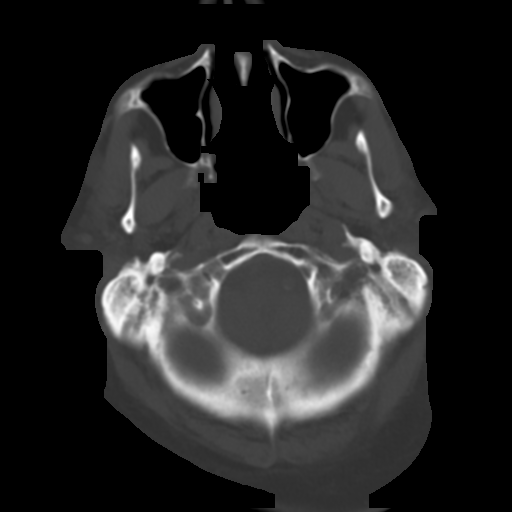
[im 5/32  brain]
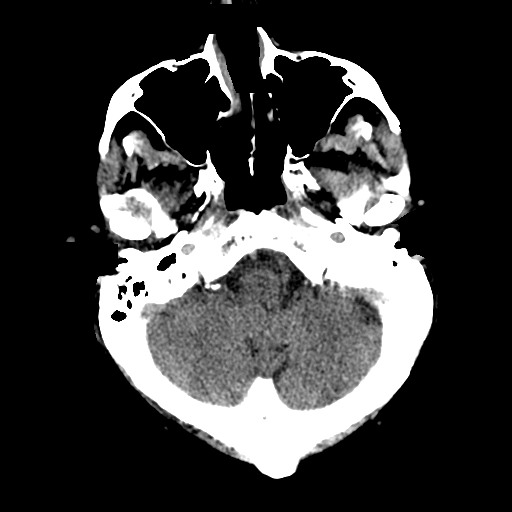
[im 7/32  brain]
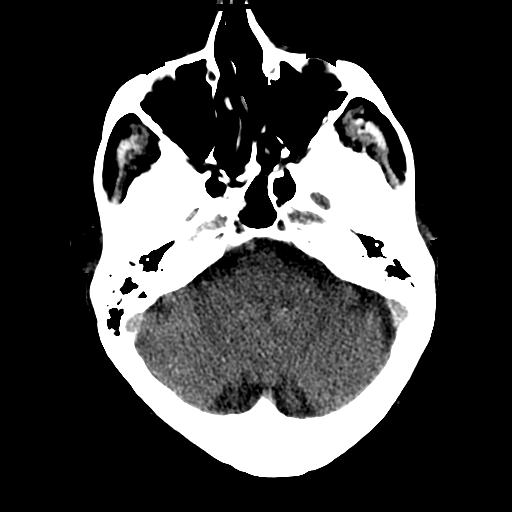
[im 9/32  brain]
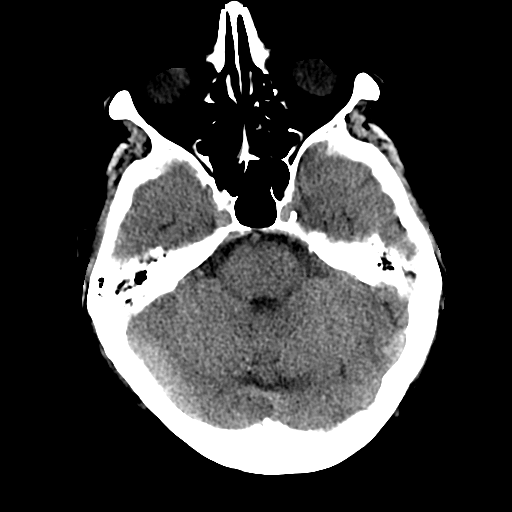
[im 12/32  brain]
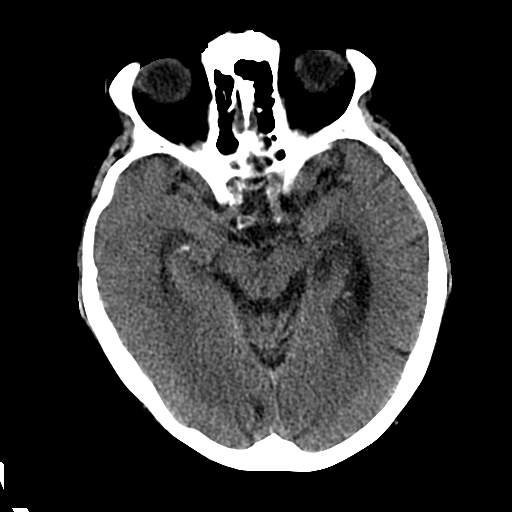
[im 12/32  bone]
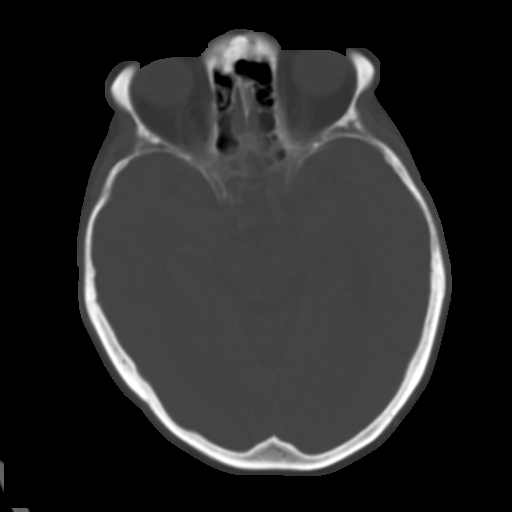
[im 14/32  brain]
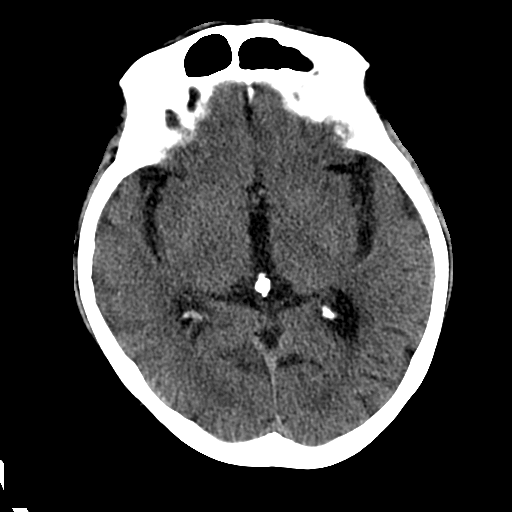
[im 18/32  brain]
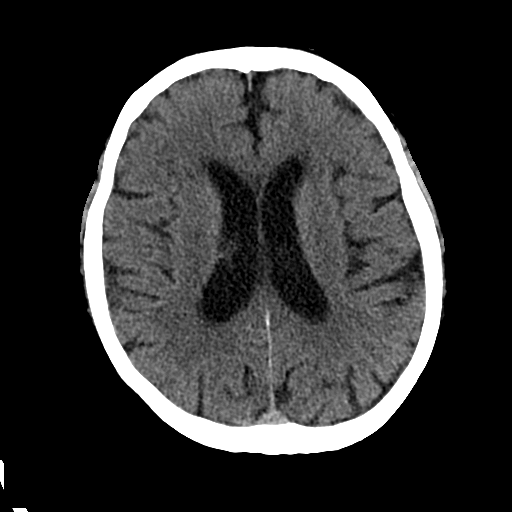
[im 20/32  brain]
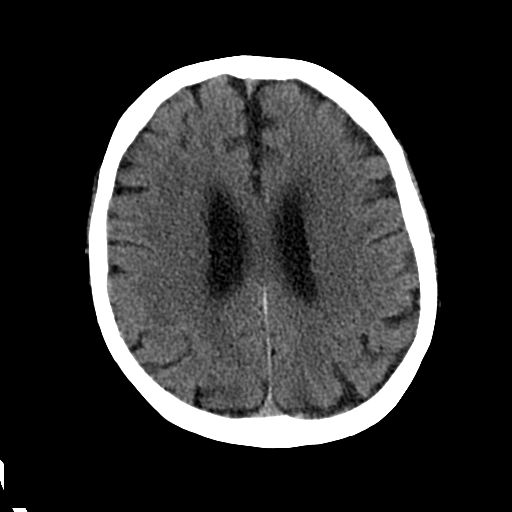
[im 23/32  brain]
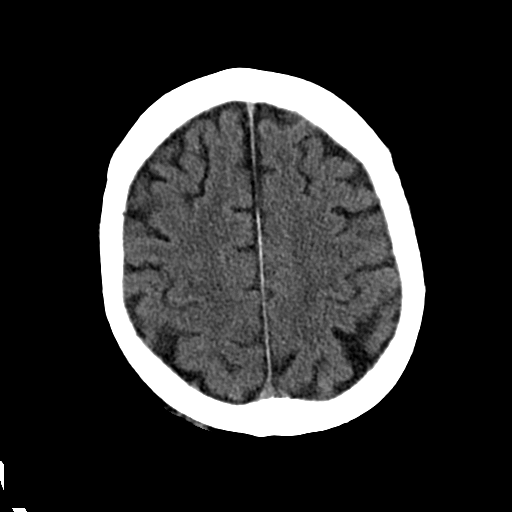
[im 23/32  bone]
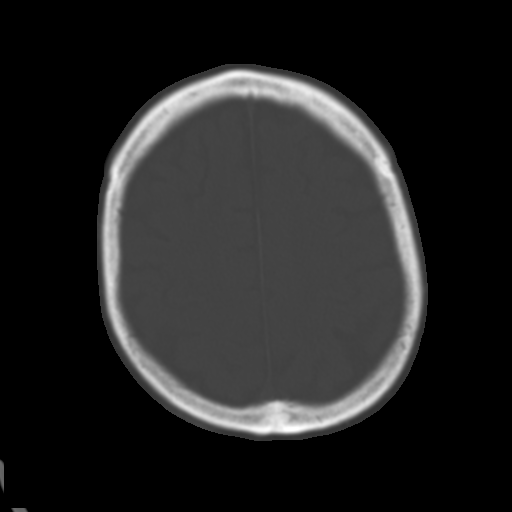
[im 25/32  brain]
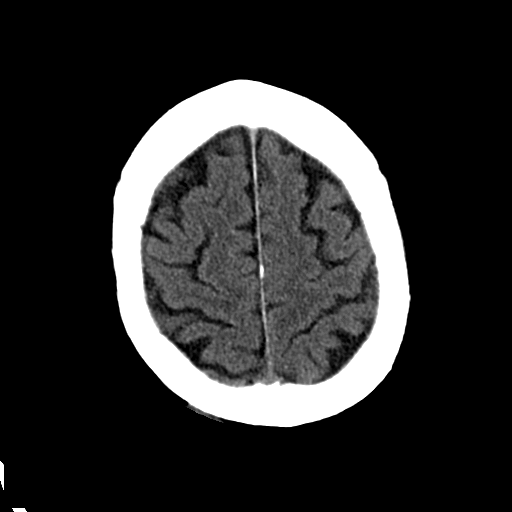
[im 27/32  brain]
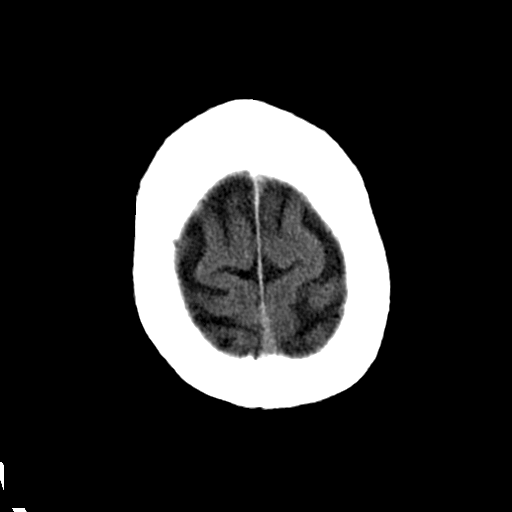
[im 29/32  brain]
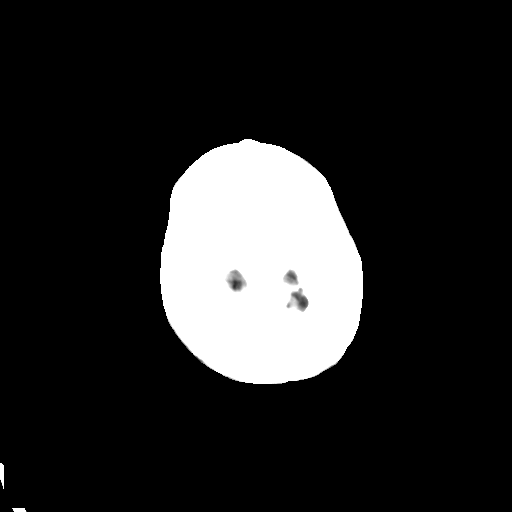

[Series 203: coronal st, idose (1) · coronal · 0.40mm/px · 3 of 62 slices shown]
[im 21/62  brain]
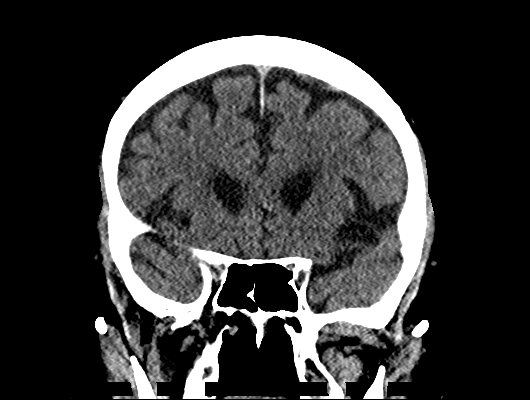
[im 28/62  brain]
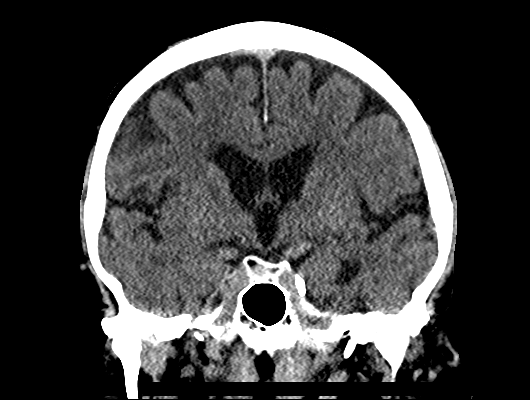
[im 34/62  brain]
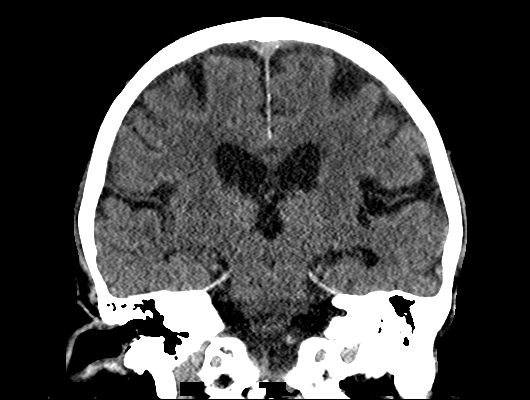

[Series 204: sagittal st, idose (1) · sagittal · 0.40mm/px · 3 of 53 slices shown]
[im 18/53  brain]
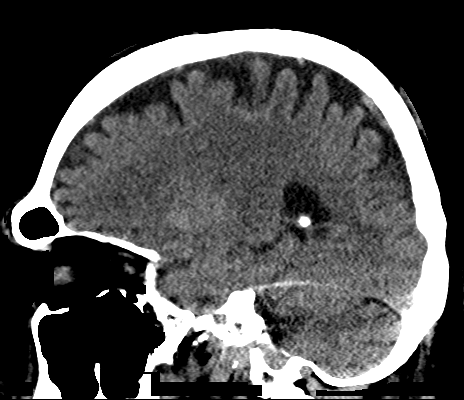
[im 27/53  brain]
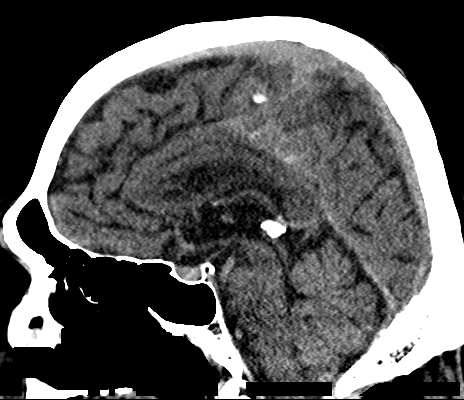
[im 35/53  brain]
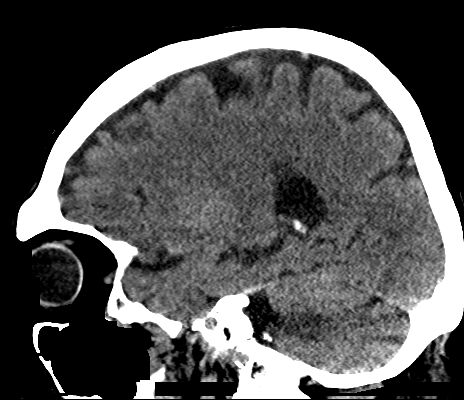

[18 of 47 positions shown; findings below may reference images not displayed]

FINDINGS: Brain: No evidence of acute infarction, hemorrhage, extra-axial
collection, ventriculomegaly, or mass effect. Generalized cerebral
atrophy. Periventricular white matter low attenuation likely
secondary to microangiopathy.

Vascular: Cerebrovascular atherosclerotic calcifications are noted.

Skull: Negative for fracture or focal lesion.

Sinuses/Orbits: Visualized portions of the orbits are unremarkable.
Visualized portions of the paranasal sinuses and mastoid air cells
are unremarkable.

Other: None.
IMPRESSION: No acute intracranial pathology.

## 2017-07-19 IMAGING — DX DG CHEST 2V
2 series · 2 of 2 positions shown · non-contrast
Comparison: 04/09/2016

CLINICAL DATA: Bilateral leg swelling.  History of CHF

EXAM:
CHEST  2 VIEW

[chest lat]
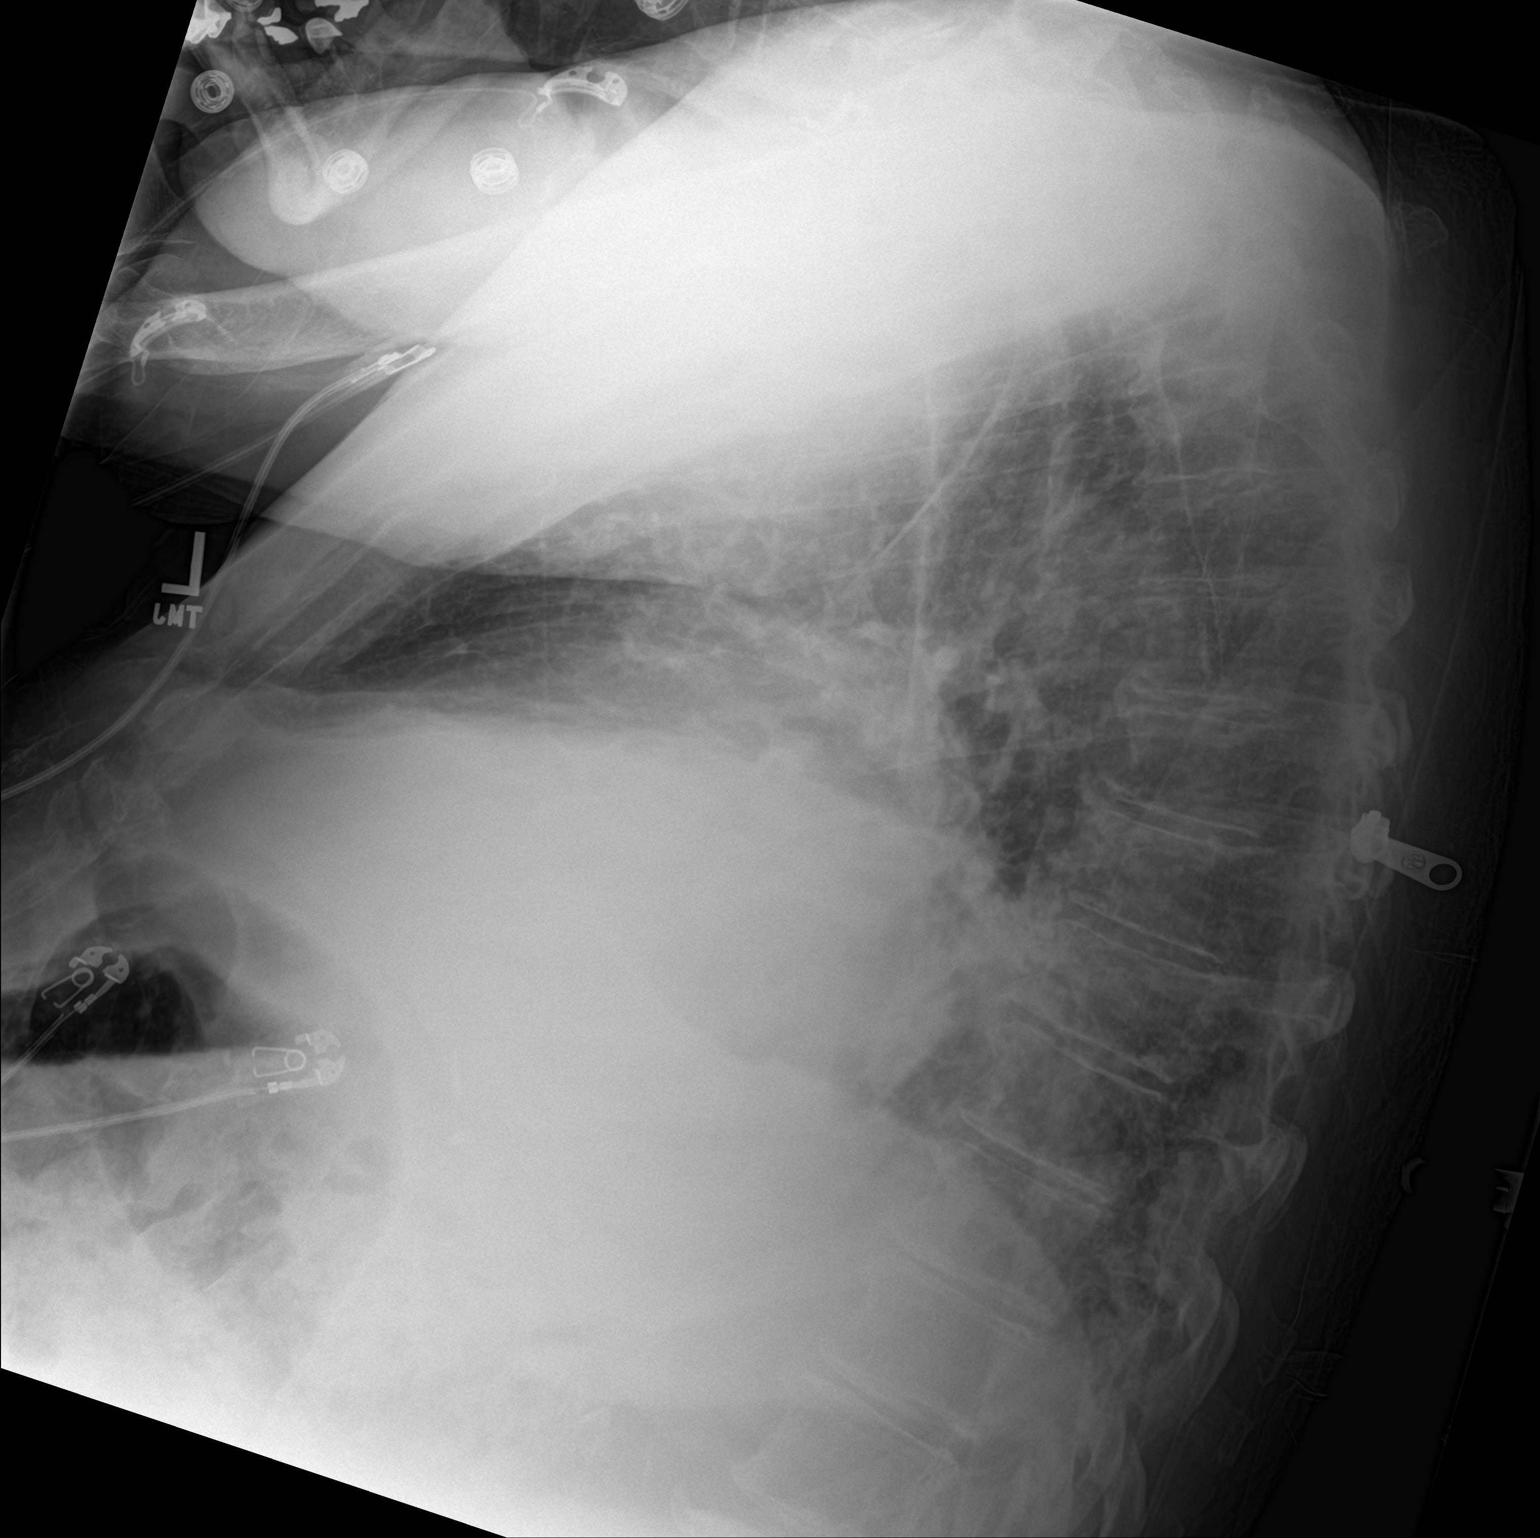

[chest ap]
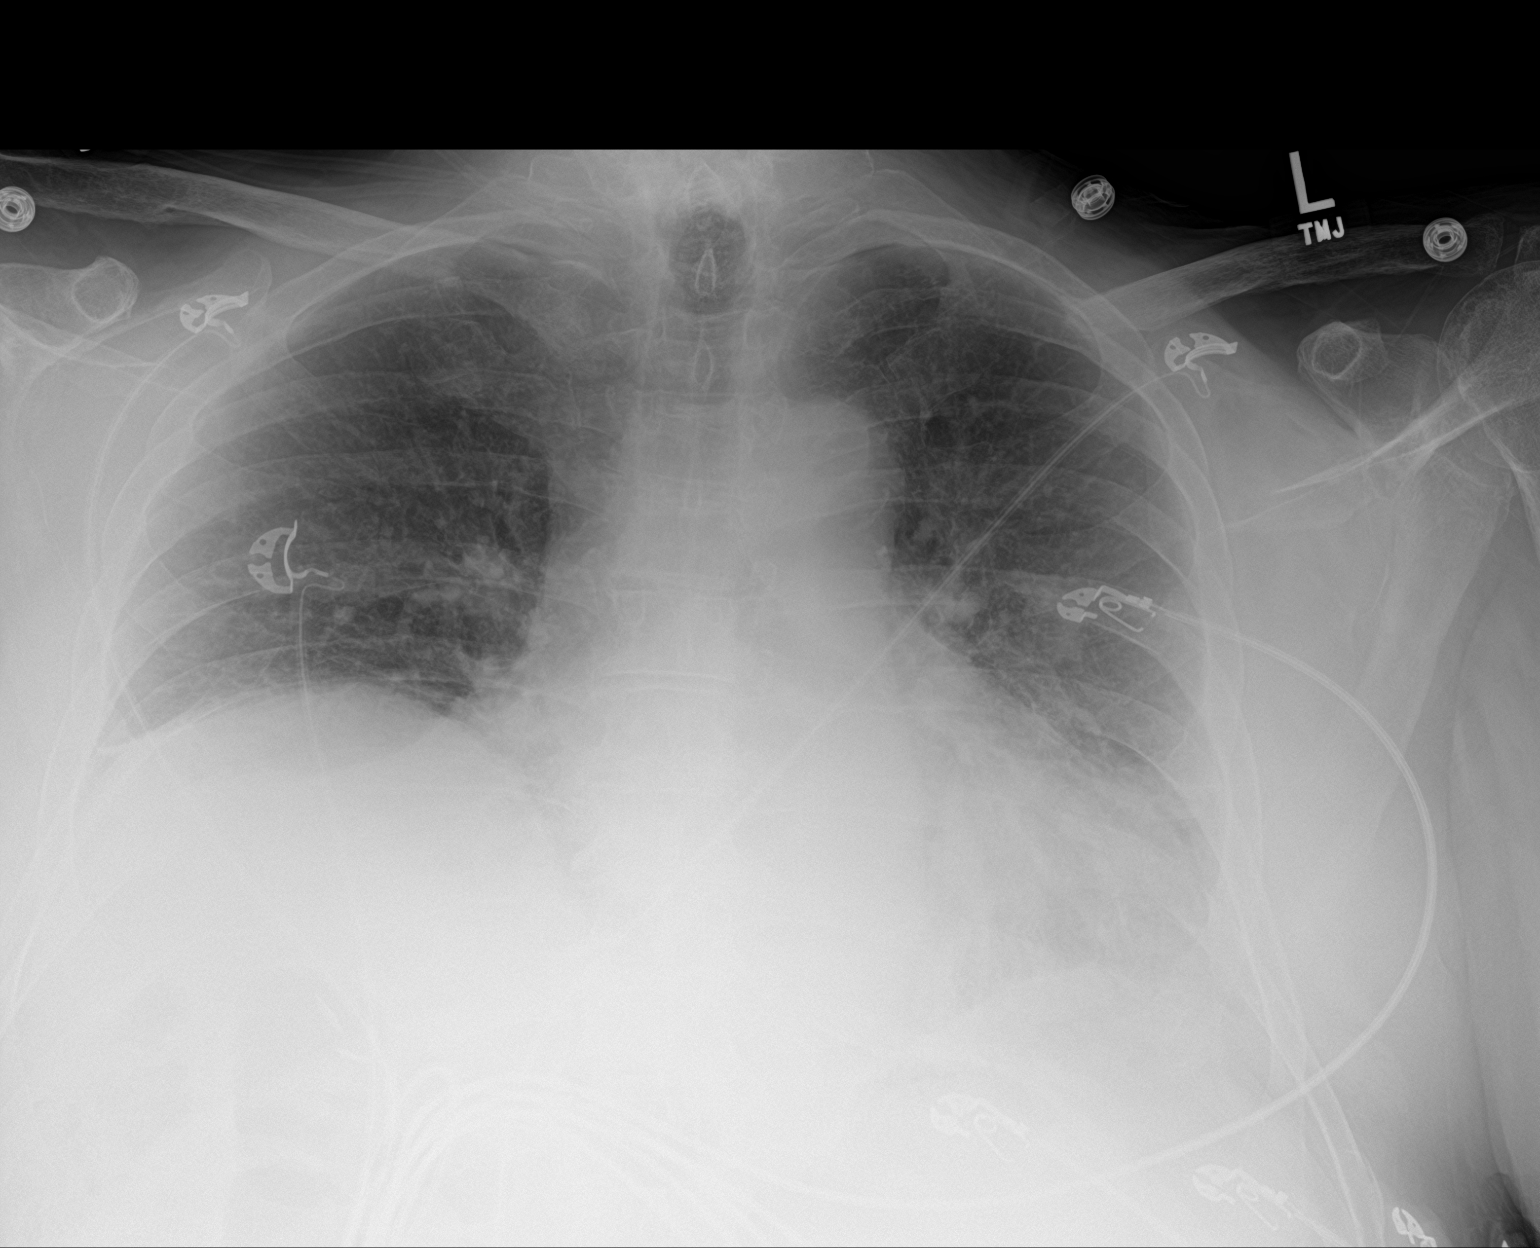

[2 of 2 positions shown; findings below may reference images not displayed]

FINDINGS: Cardiomegaly. Elevation of the right hemidiaphragm is stable. Mild
vascular congestion. No overt edema. Bibasilar atelectasis noted. No
visible effusions.
IMPRESSION: Cardiomegaly with vascular congestion.

Elevated right hemidiaphragm.  Bibasilar atelectasis.

## 2017-07-30 IMAGING — CR DG CHEST 1V PORT
1 series · 1 of 1 positions shown · non-contrast
Comparison: 09/12/2016

CLINICAL DATA: SOB and entire abdominal pain. No chest pain. Also
reports coughing up phlegm that's been going on for months.

EXAM:
PORTABLE CHEST 1 VIEW

[AP]
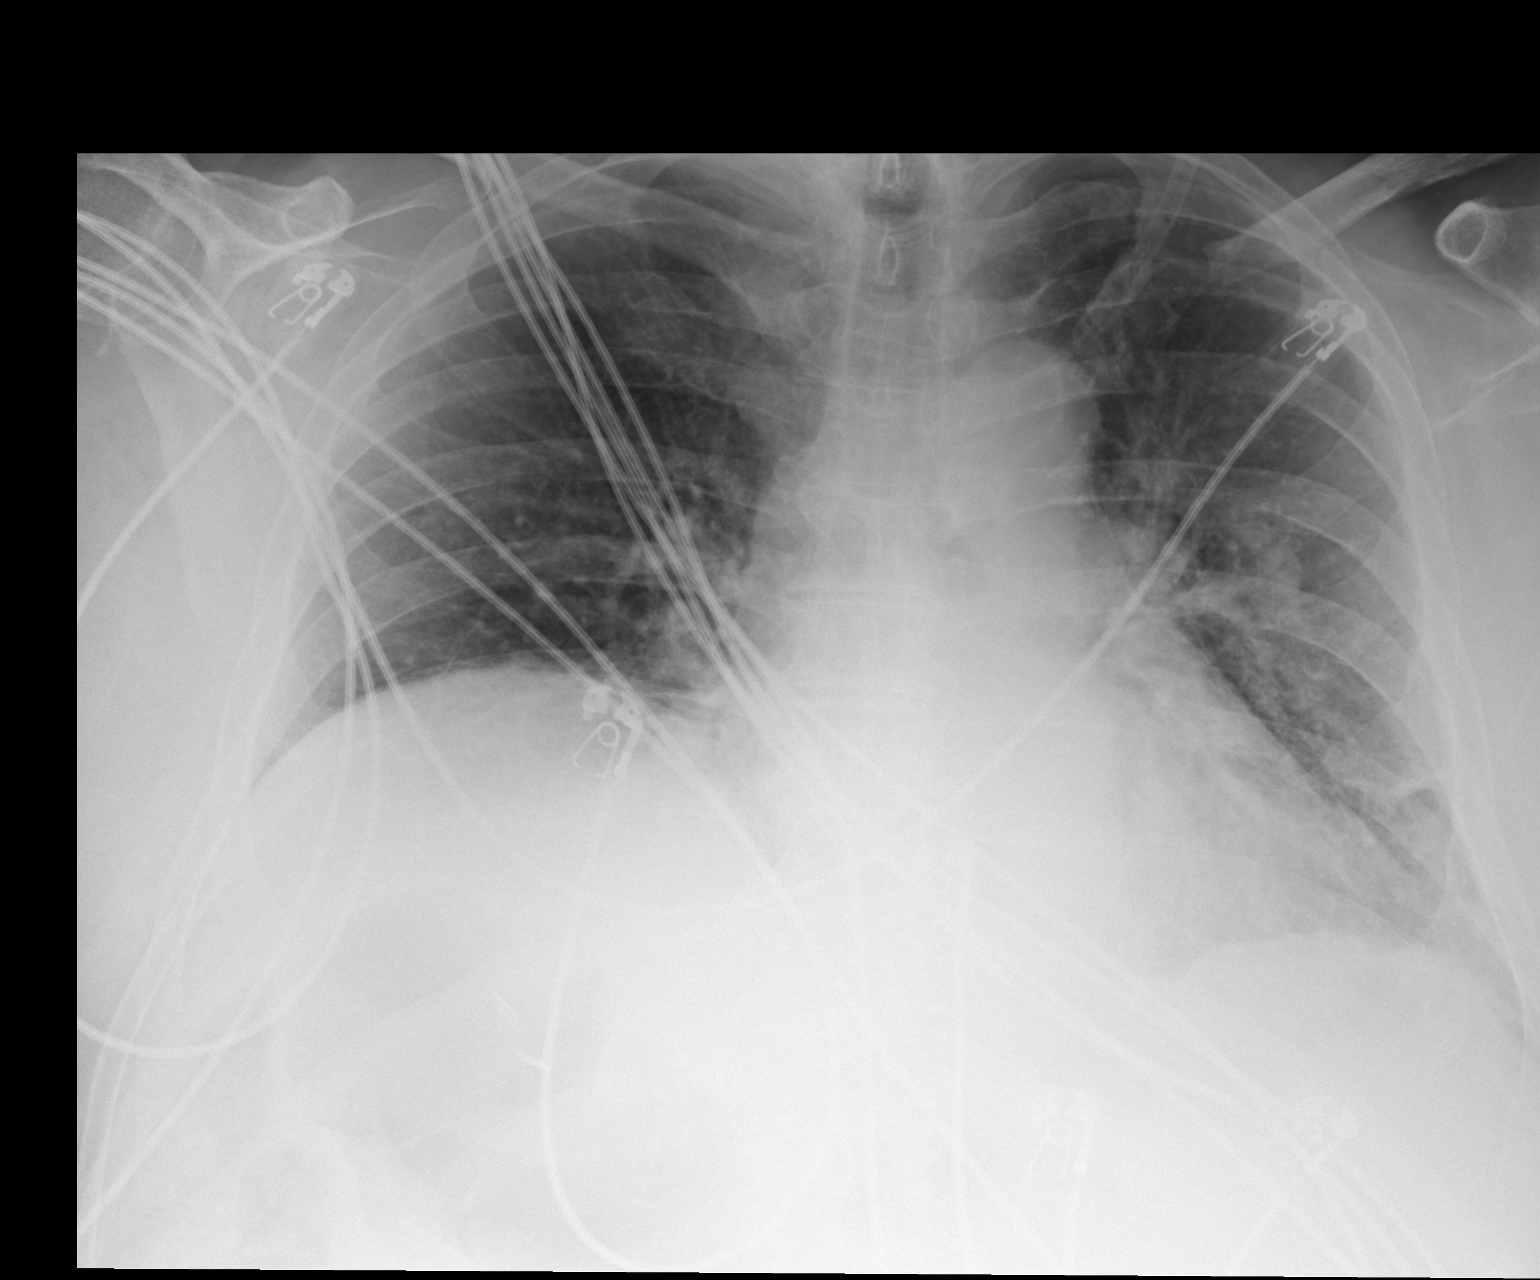

[1 of 1 positions shown; findings below may reference images not displayed]

FINDINGS: Mildly degraded exam due to AP portable technique and patient body
habitus. Numerous leads and wires project over the chest. Midline
trachea. Cardiomegaly accentuated by AP portable technique. Moderate
right hemidiaphragm elevation. No pleural effusion or pneumothorax.
Extremely low lung volumes, with resultant pulmonary interstitial
prominence. Patchy left mid and lower lung pulmonary opacities are
similar to the 04/09/2016 exam and likely a present on 09/12/2016.
IMPRESSION: Cardiomegaly with persistent or recurrent patchy left-sided airspace
disease. Suspicious for pneumonia. Chronic scarring or even
calcified pleural plaques could have a similar appearance.

Cardiomegaly with right hemidiaphragm elevation and low lung
volumes. No congestive failure.
